# Patient Record
Sex: Female | Born: 1975 | Hispanic: Yes | Smoking: Never smoker
Health system: Southern US, Community
[De-identification: ages and names within clinical notes are randomized; demographics above are authoritative.]

## PROBLEM LIST (undated history)

## (undated) ENCOUNTER — Inpatient Hospital Stay (HOSPITAL_COMMUNITY): Payer: Self-pay

## (undated) DIAGNOSIS — I82409 Acute embolism and thrombosis of unspecified deep veins of unspecified lower extremity: Secondary | ICD-10-CM

## (undated) HISTORY — DX: Acute embolism and thrombosis of unspecified deep veins of unspecified lower extremity: I82.409

## (undated) HISTORY — PX: NO PAST SURGERIES: SHX2092

---

## 1998-11-28 ENCOUNTER — Other Ambulatory Visit: Admission: RE | Admit: 1998-11-28 | Discharge: 1998-11-28 | Payer: Self-pay | Admitting: Obstetrics

## 1999-05-14 ENCOUNTER — Inpatient Hospital Stay (HOSPITAL_COMMUNITY): Admission: AD | Admit: 1999-05-14 | Discharge: 1999-05-15 | Payer: Self-pay | Admitting: *Deleted

## 2002-07-03 ENCOUNTER — Other Ambulatory Visit: Admission: RE | Admit: 2002-07-03 | Discharge: 2002-07-03 | Payer: Self-pay | Admitting: Obstetrics and Gynecology

## 2003-01-29 ENCOUNTER — Inpatient Hospital Stay (HOSPITAL_COMMUNITY): Admission: AD | Admit: 2003-01-29 | Discharge: 2003-01-30 | Payer: Self-pay | Admitting: Obstetrics and Gynecology

## 2006-05-16 ENCOUNTER — Emergency Department (HOSPITAL_COMMUNITY): Admission: EM | Admit: 2006-05-16 | Discharge: 2006-05-16 | Payer: Self-pay | Admitting: *Deleted

## 2006-05-28 ENCOUNTER — Emergency Department (HOSPITAL_COMMUNITY): Admission: EM | Admit: 2006-05-28 | Discharge: 2006-05-28 | Payer: Self-pay | Admitting: *Deleted

## 2006-05-28 ENCOUNTER — Ambulatory Visit (HOSPITAL_COMMUNITY): Admission: RE | Admit: 2006-05-28 | Discharge: 2006-05-28 | Payer: Self-pay | Admitting: Orthopedic Surgery

## 2006-05-28 ENCOUNTER — Encounter (INDEPENDENT_AMBULATORY_CARE_PROVIDER_SITE_OTHER): Payer: Self-pay | Admitting: *Deleted

## 2006-05-31 ENCOUNTER — Encounter: Payer: Self-pay | Admitting: Vascular Surgery

## 2006-05-31 ENCOUNTER — Ambulatory Visit (HOSPITAL_COMMUNITY): Admission: RE | Admit: 2006-05-31 | Discharge: 2006-05-31 | Payer: Self-pay | Admitting: *Deleted

## 2007-10-19 ENCOUNTER — Ambulatory Visit (HOSPITAL_COMMUNITY): Admission: RE | Admit: 2007-10-19 | Discharge: 2007-10-19 | Payer: Self-pay | Admitting: Obstetrics & Gynecology

## 2007-10-19 ENCOUNTER — Encounter: Payer: Self-pay | Admitting: Obstetrics and Gynecology

## 2007-10-19 ENCOUNTER — Ambulatory Visit: Payer: Self-pay | Admitting: Obstetrics & Gynecology

## 2007-10-19 ENCOUNTER — Encounter: Payer: Self-pay | Admitting: Family Medicine

## 2007-10-19 LAB — CONVERTED CEMR LAB: Pap Smear: NORMAL

## 2007-11-02 ENCOUNTER — Ambulatory Visit: Payer: Self-pay | Admitting: Obstetrics & Gynecology

## 2007-11-07 ENCOUNTER — Ambulatory Visit: Payer: Self-pay | Admitting: *Deleted

## 2007-11-14 ENCOUNTER — Ambulatory Visit: Payer: Self-pay | Admitting: Obstetrics & Gynecology

## 2007-11-21 ENCOUNTER — Ambulatory Visit: Payer: Self-pay | Admitting: Obstetrics & Gynecology

## 2007-11-25 ENCOUNTER — Ambulatory Visit: Payer: Self-pay | Admitting: Obstetrics & Gynecology

## 2007-11-25 ENCOUNTER — Inpatient Hospital Stay (HOSPITAL_COMMUNITY): Admission: AD | Admit: 2007-11-25 | Discharge: 2007-11-27 | Payer: Self-pay | Admitting: Obstetrics & Gynecology

## 2007-12-01 ENCOUNTER — Ambulatory Visit: Payer: Self-pay | Admitting: Family Medicine

## 2007-12-01 DIAGNOSIS — Z8672 Personal history of thrombophlebitis: Secondary | ICD-10-CM

## 2007-12-05 ENCOUNTER — Ambulatory Visit: Payer: Self-pay | Admitting: Family Medicine

## 2007-12-05 LAB — CONVERTED CEMR LAB: INR: 2.2

## 2007-12-08 ENCOUNTER — Ambulatory Visit: Payer: Self-pay | Admitting: Family Medicine

## 2007-12-15 ENCOUNTER — Ambulatory Visit: Payer: Self-pay | Admitting: Family Medicine

## 2007-12-15 LAB — CONVERTED CEMR LAB: INR: 3.2

## 2007-12-26 ENCOUNTER — Ambulatory Visit: Payer: Self-pay | Admitting: Family Medicine

## 2008-01-04 ENCOUNTER — Ambulatory Visit: Payer: Self-pay | Admitting: Family Medicine

## 2008-01-19 ENCOUNTER — Ambulatory Visit: Payer: Self-pay | Admitting: Family Medicine

## 2008-01-19 LAB — CONVERTED CEMR LAB: INR: 1.8

## 2008-02-01 ENCOUNTER — Ambulatory Visit: Payer: Self-pay | Admitting: Family Medicine

## 2008-02-08 ENCOUNTER — Ambulatory Visit: Payer: Self-pay | Admitting: Family Medicine

## 2008-02-15 ENCOUNTER — Encounter: Payer: Self-pay | Admitting: Family Medicine

## 2008-02-22 ENCOUNTER — Ambulatory Visit: Payer: Self-pay | Admitting: Family Medicine

## 2008-03-07 ENCOUNTER — Ambulatory Visit: Payer: Self-pay | Admitting: Family Medicine

## 2008-03-07 LAB — CONVERTED CEMR LAB: INR: 1.8

## 2008-03-22 ENCOUNTER — Ambulatory Visit: Payer: Self-pay | Admitting: Family Medicine

## 2008-04-05 ENCOUNTER — Encounter: Payer: Self-pay | Admitting: Family Medicine

## 2008-04-11 ENCOUNTER — Ambulatory Visit: Payer: Self-pay | Admitting: Family Medicine

## 2008-05-02 ENCOUNTER — Encounter: Payer: Self-pay | Admitting: Family Medicine

## 2008-05-22 ENCOUNTER — Ambulatory Visit: Payer: Self-pay | Admitting: Family Medicine

## 2008-05-22 LAB — CONVERTED CEMR LAB: INR: 1.9

## 2008-06-05 ENCOUNTER — Encounter: Payer: Self-pay | Admitting: Family Medicine

## 2010-08-17 ENCOUNTER — Encounter: Payer: Self-pay | Admitting: Family Medicine

## 2010-08-17 ENCOUNTER — Encounter: Payer: Self-pay | Admitting: *Deleted

## 2011-04-20 LAB — POCT URINALYSIS DIP (DEVICE)
Ketones, ur: NEGATIVE
Operator id: 148111
Protein, ur: NEGATIVE
Urobilinogen, UA: 0.2

## 2011-04-21 LAB — POCT URINALYSIS DIP (DEVICE)
Ketones, ur: NEGATIVE
Nitrite: NEGATIVE
Nitrite: NEGATIVE
Operator id: 194561
Operator id: 297281
Operator id: 194561
Operator id: 297281
Protein, ur: NEGATIVE
Protein, ur: NEGATIVE
Protein, ur: NEGATIVE
Protein, ur: NEGATIVE
Urobilinogen, UA: 0.2
Urobilinogen, UA: 0.2
Urobilinogen, UA: 0.2
Urobilinogen, UA: 0.2
pH: 7

## 2013-05-24 LAB — CHG CYTOPATH CERV/VAG THIN LAYER: Pap Smear: NEGATIVE

## 2013-07-27 NOTE — L&D Delivery Note (Addendum)
Patient is 38 y.o. 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  APGAR: 9, 9; weight  pending Placenta status: intact .  Cord: 3 vessel  with the following complications: none  Anesthesia: Epidural  Episiotomy:  N/a Lacerations:  n/a Suture Repair: n/a Est. Blood Loss (mL):  200  Mom to postpartum.  Baby to Couplet care / Skin to Skin.  Melanie Bass 06/08/2014, 1:24 PM    Notified that RN evacuated about 150cc in clots, 1000mcg cytotec given PO.  I removed 350 clots manually, fundus now firm.  Again In and out cath with minimal urine output (pt has already urinated).  Total EBL: 200cc + 150 +350 = 700cc  Stage I PPH, continue with pitocin bolus, hemabate given with lomotil and patient advised of side effects.  STAT CBC.  Melanie Bass,Melanie Chopin ROCIO, MD 3:17 PM

## 2013-12-28 ENCOUNTER — Inpatient Hospital Stay (HOSPITAL_COMMUNITY)
Admission: AD | Admit: 2013-12-28 | Discharge: 2013-12-28 | Disposition: A | Discharge status: Home / Self Care | Payer: Self-pay | Source: Ambulatory Visit | Attending: Obstetrics and Gynecology | Admitting: Obstetrics and Gynecology

## 2013-12-28 ENCOUNTER — Encounter (HOSPITAL_COMMUNITY): Payer: Self-pay

## 2013-12-28 DIAGNOSIS — O368190 Decreased fetal movements, unspecified trimester, not applicable or unspecified: Secondary | ICD-10-CM

## 2013-12-28 DIAGNOSIS — O0930 Supervision of pregnancy with insufficient antenatal care, unspecified trimester: Secondary | ICD-10-CM | POA: Insufficient documentation

## 2013-12-28 MED ORDER — PRENATAL VITAMIN 27-0.8 MG PO TABS: 1.0000 | ORAL_TABLET | Freq: Every day | ORAL | Status: DC

## 2013-12-28 NOTE — MAU Provider Note (Signed)
  History     CSN: [id number]  Arrival date and time: 12/28/13 1236   First Provider Initiated Contact with Patient 12/28/13 1323      No chief complaint on file.  HPI Pt is G5P4004 here for concern decreased fetal movement. She is approx 20 wk by uncertain LMP, no prenatal care. No CTX, no vaginal bleeding/discharge.   She just feels like this baby isn't moving as much. Not doing kick counts. ALso stressed dot to recent death of FOB. In process of setting up prenatal care.    OB History   Grav Para Term Preterm Abortions TAB SAB Ect Mult Living   5 4 4       4       Past Medical History  Diagnosis Date  . Medical history non-contributory     Past Surgical History  Procedure Laterality Date  . No past surgeries      History reviewed. No pertinent family history.  History  Substance Use Topics  . Smoking status: Never Smoker   . Smokeless tobacco: Not on file  . Alcohol Use: No    Allergies: No Known Allergies  Prescriptions prior to admission  Medication Sig Dispense Refill  . Prenatal Vit-Fe Fumarate-FA (PRENATAL MULTIVITAMIN) TABS tablet Take 1 tablet by mouth daily at 12 noon.        Review of Systems  Constitutional: Negative for fever and chills.  Respiratory: Negative for cough and shortness of breath.   Cardiovascular: Negative for chest pain.  Gastrointestinal: Negative for heartburn, nausea, vomiting and abdominal pain.   Physical Exam   Blood pressure 121/63, pulse 75, temperature 99.1 F (37.3 C), temperature source Oral, resp. rate 16, height 5\' 2"  (1.575 m), weight 71.124 kg (156 lb 12.8 oz), last menstrual period 08/05/2013, SpO2 100.00%.  Physical Exam  Constitutional: She is oriented to person, place, and time. She appears well-developed and well-nourished.  Cardiovascular: Normal rate, regular rhythm and normal heart sounds.   No murmur heard. Respiratory: Breath sounds normal. No respiratory distress. She exhibits no tenderness.  GI:  There is no tenderness.  Musculoskeletal: She exhibits no edema.  Neurological: She is alert and oriented to person, place, and time.  Skin: Skin is warm and dry.  FHT performed by nurse: 150  MAU Course  Procedures: none    Assessment and Plan   Perceived decreased fetal movement - reassurance given, FHT present, pt instructed to establish care as outpatient asap. No indication for other labs or [place] today, she will need these with her primary OB. Patient verbalizes understanding. Pt currently coordinating prenatal care.  No prenatal care - see above   Sunnie Nielsen 12/28/2013, 1:34 PM   I spoke with and examined patient and agree with resident's note and plan of care.  Tawana Scale, MD OB Fellow 01/01/2014 5:28 PM

## 2013-12-28 NOTE — MAU Note (Signed)
Patient states she has not had prenatal care and cannot get an appointment until July. Denies pain or bleeding. State the FOB was killed in April and she has been stressed and concerned when she had not felt movement yet.

## 2013-12-28 NOTE — MAU Note (Signed)
Pt denies any vag bleeding, discharge or leaking.  Her only concern is that she perceives the baby is not moving as much as it should.  She has been under a lot of stress and not sure if that is the problem.

## 2013-12-28 NOTE — Discharge Instructions (Signed)
Evaluacin de los movimientos fetales  (Fetal Movement Counts) Nombre del paciente: __________________________________________________ Fecha de parto estimada: ____________________ La evaluacin de los movimientos fetales es muy recomendable en los embarazos de alto riesgo, pero tambin es una buena idea que lo hagan todas las embarazadas. El mdico le indicar que comience a contarlos a las 28 semanas de embarazo. Los movimientos fetales suelen aumentar:   Despus de una comida completa.  Despus de la actividad fsica.  Despus de comer o beber algo dulce o fro.  En reposo. Preste atencin cuando sienta que el beb est ms activo. Esto le ayudar a notar un patrn de ciclos de vigilia y sueo de su beb y cules son los factores que contribuyen a un aumento de los movimientos fetales. Es importante llevar a cabo un recuento de movimientos fetales, al mismo tiempo cada da, cuando el beb normalmente est ms activo.  CMO CONTAR LOS MOVIMIENTOS FETALES 1. Busque un lugar tranquilo y cmodo para sentarse o recostarse sobre el lado izquierdo. Al recostarse sobre su lado izquierdo, le proporciona una mejor circulacin de sangre y oxgeno al beb. 2. Anote el da y la hora en una hoja de papel o en un diario. 3. Comience contando las pataditas, revoloteos, chasquidos, vueltas o pinchazos en un perodo de 2 horas. Debe sentir al menos 10 movimientos en 2 horas. 4. Si no siente 10 movimientos en 2 horas, espere 2  3 horas y cuente de nuevo. Busque cambios en el patrn o si no cuenta lo suficiente en 2 horas. SOLICITE ATENCIN MDICA SI:   Siente menos de 10 pataditas en 2 horas, en dos intentos.  No hay movimientos durante una hora.  El patrn se modifica o le lleva ms tiempo cada da contar las 10 pataditas.  Siente que el beb no se mueve como lo hace habitualmente. Fecha: ____________ Movimientos: ____________ Hora de inicio: ____________ Hora de finalizacin: ____________  Fecha:  ____________ Movimientos: ____________ Hora de inicio: ____________ Hora de finalizacin: ____________  Fecha: ____________ Movimientos: ____________ Hora de inicio: ____________ Hora de finalizacin: ____________  Fecha: ____________ Movimientos: ____________ Hora de inicio: ____________ Hora de finalizacin: ____________  Fecha: ____________ Movimientos: ____________ Hora de inicio: ____________ Hora de finalizacin: ____________  Fecha: ____________ Movimientos: ____________ Hora de inicio: ____________ Hora de finalizacin: ____________  Fecha: ____________ Movimientos: ____________ Hora de inicio: ____________ Hora de finalizacin: ____________  Fecha: ____________ Movimientos: ____________ Hora de inicio: ____________ Hora de finalizacin: ____________  Fecha: ____________ Movimientos: ____________ Hora de inicio: ____________ Hora de finalizacin: ____________  Fecha: ____________ Movimientos: ____________ Hora de inicio: ____________ Hora de finalizacin: ____________  Fecha: ____________ Movimientos: ____________ Hora de inicio: ____________ Hora de finalizacin: ____________  Fecha: ____________ Movimientos: ____________ Hora de inicio: ____________ Hora de finalizacin: ____________  Fecha: ____________ Movimientos: ____________ Hora de inicio: ____________ Hora de finalizacin: ____________  Fecha: ____________ Movimientos: ____________ Hora de inicio: ____________ Hora de finalizacin: ____________  Fecha: ____________ Movimientos: ____________ Hora de inicio: ____________ Hora de finalizacin: ____________  Fecha: ____________ Movimientos: ____________ Hora de inicio: ____________ Hora de finalizacin: ____________  Fecha: ____________ Movimientos: ____________ Hora de inicio: ____________ Hora de finalizacin: ____________  Fecha: ____________ Movimientos: ____________ Hora de inicio: ____________ Hora de finalizacin: ____________  Fecha: ____________ Movimientos: ____________ Hora  de inicio: ____________ Hora de finalizacin: ____________  Fecha: ____________ Movimientos: ____________ Hora de inicio: ____________ Hora de finalizacin: ____________  Fecha: ____________ Movimientos: ____________ Hora de inicio: ____________ Hora de finalizacin: ____________  Fecha: ____________ Movimientos: ____________ Hora de inicio: ____________ Hora de   finalizacin: ____________  Fecha: ____________ Movimientos: ____________ Hora de inicio: ____________ Hora de finalizacin: ____________  Fecha: ____________ Movimientos: ____________ Hora de inicio: ____________ Hora de finalizacin: ____________  Fecha: ____________ Movimientos: ____________ Hora de inicio: ____________ Hora de finalizacin: ____________  Fecha: ____________ Movimientos: ____________ Hora de inicio: ____________ Hora de finalizacin: ____________  Fecha: ____________ Movimientos: ____________ Hora de inicio: ____________ Hora de finalizacin: ____________  Fecha: ____________ Movimientos: ____________ Hora de inicio: ____________ Hora de finalizacin: ____________  Fecha: ____________ Movimientos: ____________ Hora de inicio: ____________ Hora de finalizacin: ____________  Fecha: ____________ Movimientos: ____________ Hora de inicio: ____________ Hora de finalizacin: ____________  Fecha: ____________ Movimientos: ____________ Hora de inicio: ____________ Hora de finalizacin: ____________  Fecha: ____________ Movimientos: ____________ Hora de inicio: ____________ Hora de finalizacin: ____________  Fecha: ____________ Movimientos: ____________ Hora de inicio: ____________ Hora de finalizacin: ____________  Fecha: ____________ Movimientos: ____________ Hora de inicio: ____________ Hora de finalizacin: ____________  Fecha: ____________ Movimientos: ____________ Hora de inicio: ____________ Hora de finalizacin: ____________  Fecha: ____________ Movimientos: ____________ Hora de inicio: ____________ Hora de finalizacin:  ____________  Fecha: ____________ Movimientos: ____________ Hora de inicio: ____________ Hora de finalizacin: ____________  Fecha: ____________ Movimientos: ____________ Hora de inicio: ____________ Hora de finalizacin: ____________  Fecha: ____________ Movimientos: ____________ Hora de inicio: ____________ Hora de finalizacin: ____________  Fecha: ____________ Movimientos: ____________ Hora de inicio: ____________ Hora de finalizacin: ____________  Fecha: ____________ Movimientos: ____________ Hora de inicio: ____________ Hora de finalizacin: ____________  Fecha: ____________ Movimientos: ____________ Hora de inicio: ____________ Hora de finalizacin: ____________  Fecha: ____________ Movimientos: ____________ Hora de inicio: ____________ Hora de finalizacin: ____________  Fecha: ____________ Movimientos: ____________ Hora de inicio: ____________ Hora de finalizacin: ____________  Fecha: ____________ Movimientos: ____________ Hora de inicio: ____________ Hora de finalizacin: ____________  Fecha: ____________ Movimientos: ____________ Hora de inicio: ____________ Hora de finalizacin: ____________  Fecha: ____________ Movimientos: ____________ Hora de inicio: ____________ Hora de finalizacin: ____________  Fecha: ____________ Movimientos: ____________ Hora de inicio: ____________ Hora de finalizacin: ____________  Fecha: ____________ Movimientos: ____________ Hora de inicio: ____________ Hora de finalizacin: ____________  Fecha: ____________ Movimientos: ____________ Hora de inicio: ____________ Hora de finalizacin: ____________  Fecha: ____________ Movimientos: ____________ Hora de inicio: ____________ Hora de finalizacin: ____________  Fecha: ____________ Movimientos: ____________ Hora de inicio: ____________ Hora de finalizacin: ____________  Fecha: ____________ Movimientos: ____________ Hora de inicio: ____________ Hora de finalizacin: ____________  Fecha: ____________  Movimientos: ____________ Hora de inicio: ____________ Hora de finalizacin: ____________  Fecha: ____________ Movimientos: ____________ Hora de inicio: ____________ Hora de finalizacin: ____________  Fecha: ____________ Movimientos: ____________ Hora de inicio: ____________ Hora de finalizacin: ____________  Document Released: 10/20/2007 Document Revised: 06/29/2012 ExitCare Patient Information 2014 [employer].  

## 2014-01-01 NOTE — MAU Provider Note (Signed)
Attestation of Attending Supervision of Advanced Practitioner (CNM/NP): Evaluation and management procedures were performed by the Advanced Practitioner under my supervision and collaboration.  I have reviewed the Advanced Practitioner's note and chart, and I agree with the management and plan.  Claudett Bayly 01/01/2014 6:04 PM

## 2014-01-29 ENCOUNTER — Other Ambulatory Visit (HOSPITAL_COMMUNITY): Payer: Self-pay | Admitting: Nurse Practitioner

## 2014-01-29 DIAGNOSIS — Z3689 Encounter for other specified antenatal screening: Secondary | ICD-10-CM

## 2014-01-29 LAB — OB RESULTS CONSOLE HGB/HCT, BLOOD
HEMATOCRIT: 41 %
HEMOGLOBIN: 14.2 g/dL

## 2014-01-29 LAB — URINE CULTURE: URINE CULTURE, OB: NO GROWTH

## 2014-01-29 LAB — HEMOGLOBINOPATHY EVALUATION: HEMOGLOBINOPATHY PROFILE: NORMAL

## 2014-01-29 LAB — OB RESULTS CONSOLE PLATELET COUNT: PLATELETS: 285 10*3/uL

## 2014-01-29 LAB — OB RESULTS CONSOLE RPR
RPR: NONREACTIVE
RPR: NONREACTIVE

## 2014-01-29 LAB — OB RESULTS CONSOLE HEPATITIS B SURFACE ANTIGEN: HEP B S AG: NEGATIVE

## 2014-01-29 LAB — OB RESULTS CONSOLE GC/CHLAMYDIA
CHLAMYDIA, DNA PROBE: NEGATIVE
GC PROBE AMP, GENITAL: NEGATIVE

## 2014-01-31 ENCOUNTER — Ambulatory Visit (HOSPITAL_COMMUNITY)
Admission: RE | Admit: 2014-01-31 | Discharge: 2014-01-31 | Disposition: A | Discharge status: Home / Self Care | Payer: Self-pay | Source: Ambulatory Visit | Attending: Nurse Practitioner | Admitting: Nurse Practitioner

## 2014-01-31 DIAGNOSIS — Z8672 Personal history of thrombophlebitis: Secondary | ICD-10-CM

## 2014-01-31 DIAGNOSIS — A599 Trichomoniasis, unspecified: Secondary | ICD-10-CM | POA: Insufficient documentation

## 2014-01-31 DIAGNOSIS — F32A Depression, unspecified: Secondary | ICD-10-CM

## 2014-01-31 DIAGNOSIS — Z3689 Encounter for other specified antenatal screening: Secondary | ICD-10-CM

## 2014-01-31 DIAGNOSIS — O0932 Supervision of pregnancy with insufficient antenatal care, second trimester: Secondary | ICD-10-CM

## 2014-01-31 DIAGNOSIS — O0930 Supervision of pregnancy with insufficient antenatal care, unspecified trimester: Secondary | ICD-10-CM | POA: Insufficient documentation

## 2014-01-31 DIAGNOSIS — Z658 Other specified problems related to psychosocial circumstances: Secondary | ICD-10-CM | POA: Insufficient documentation

## 2014-01-31 DIAGNOSIS — IMO0002 Reserved for concepts with insufficient information to code with codable children: Secondary | ICD-10-CM | POA: Insufficient documentation

## 2014-01-31 DIAGNOSIS — F329 Major depressive disorder, single episode, unspecified: Secondary | ICD-10-CM

## 2014-01-31 NOTE — Assessment & Plan Note (Signed)
History of DVT left left 2007, on coumadin x 6 months, during 2009 pregnancy on heparin prophylax.

## 2014-01-31 NOTE — Progress Notes (Signed)
Need EDC clarified - discrepancy between LMP's from Health department  Unsure LMP around 08/16/13, per MAU records LMP 08/05/13 . No ultrasound done yet.

## 2014-02-01 ENCOUNTER — Ambulatory Visit (INDEPENDENT_AMBULATORY_CARE_PROVIDER_SITE_OTHER): Payer: Self-pay | Admitting: Obstetrics & Gynecology

## 2014-02-01 ENCOUNTER — Encounter: Payer: Self-pay | Admitting: Obstetrics & Gynecology

## 2014-02-01 ENCOUNTER — Encounter: Payer: Self-pay | Admitting: *Deleted

## 2014-02-01 VITALS — BP 128/71 | HR 78 | Temp 98.8°F | Wt 160.0 lb

## 2014-02-01 DIAGNOSIS — O09529 Supervision of elderly multigravida, unspecified trimester: Secondary | ICD-10-CM

## 2014-02-01 DIAGNOSIS — O09522 Supervision of elderly multigravida, second trimester: Secondary | ICD-10-CM

## 2014-02-01 LAB — POCT URINALYSIS DIP (DEVICE)
Bilirubin Urine: NEGATIVE
Glucose, UA: NEGATIVE mg/dL
HGB URINE DIPSTICK: NEGATIVE
Ketones, ur: NEGATIVE mg/dL
Leukocytes, UA: NEGATIVE
NITRITE: NEGATIVE
PROTEIN: NEGATIVE mg/dL
SPECIFIC GRAVITY, URINE: 1.02 (ref 1.005–1.030)
UROBILINOGEN UA: 0.2 mg/dL (ref 0.0–1.0)
pH: 7 (ref 5.0–8.0)

## 2014-02-01 MED ORDER — ENOXAPARIN SODIUM 40 MG/0.4ML ~~LOC~~ SOLN: 40.0000 mg | SUBCUTANEOUS | Status: DC

## 2014-02-01 NOTE — Progress Notes (Signed)
[facility] requesting medication assistance for pt that is [redacted] wks gestation w/ a hx of DVT and needs Lovenox.  Pt does not have insurance.  Stressed importance of applying for Medicaid as soon as possible.  MATCH assist was done for pt via [facility].  Instructed RN that this process will take some time and that pt needs to wait several hours before going to [facility].  Nurse voiced understanding.  CM verified w/ [facility] that Rx was received and that [facility].  [facility] has CM's name and number if any questions.  TJohnson, RNBSN   [phone]

## 2014-02-01 NOTE — Progress Notes (Signed)
Edema-feet New ob packet given  FOB passed away in 10/2013

## 2014-02-01 NOTE — Progress Notes (Signed)
Pt had DVT in 2007 after leg trauma.  Needs to be on Lovenox 40mg  daily.  Case Management to help with obtaining medications ([facility]).   Otherwise pt very healthy with no medical history.  NSVD term deliveries x4.  Pt wants [place].  Reviewed complete records from the health dept.   Offered genetic counseling.  Pt wants to think about it.

## 2014-02-01 NOTE — Progress Notes (Signed)
Called case management about patient receiving help with lovenox Rx. Care management [facility] medication. Patient informed and knows to pick up medication there and that it will cost around $3. Patient has filled out medicaid paperwork but hasn't heard anything else. Encouraged patient to talk with reina after leaving today

## 2014-02-07 ENCOUNTER — Encounter: Payer: Self-pay | Admitting: General Practice

## 2014-02-14 ENCOUNTER — Encounter: Payer: Self-pay | Admitting: *Deleted

## 2014-02-19 ENCOUNTER — Encounter: Payer: Self-pay | Admitting: Family Medicine

## 2014-02-23 ENCOUNTER — Encounter: Payer: Self-pay | Admitting: *Deleted

## 2014-03-01 ENCOUNTER — Encounter: Payer: Self-pay | Admitting: Obstetrics and Gynecology

## 2014-03-01 ENCOUNTER — Ambulatory Visit (INDEPENDENT_AMBULATORY_CARE_PROVIDER_SITE_OTHER): Payer: Self-pay | Admitting: Obstetrics and Gynecology

## 2014-03-01 VITALS — BP 126/66 | HR 73 | Temp 97.7°F | Wt 162.8 lb

## 2014-03-01 DIAGNOSIS — O0932 Supervision of pregnancy with insufficient antenatal care, second trimester: Secondary | ICD-10-CM

## 2014-03-01 DIAGNOSIS — O09529 Supervision of elderly multigravida, unspecified trimester: Secondary | ICD-10-CM

## 2014-03-01 DIAGNOSIS — O0930 Supervision of pregnancy with insufficient antenatal care, unspecified trimester: Secondary | ICD-10-CM

## 2014-03-01 DIAGNOSIS — Z658 Other specified problems related to psychosocial circumstances: Secondary | ICD-10-CM

## 2014-03-01 DIAGNOSIS — Z733 Stress, not elsewhere classified: Secondary | ICD-10-CM

## 2014-03-01 DIAGNOSIS — O09522 Supervision of elderly multigravida, second trimester: Secondary | ICD-10-CM

## 2014-03-01 DIAGNOSIS — Z8672 Personal history of thrombophlebitis: Secondary | ICD-10-CM

## 2014-03-01 DIAGNOSIS — IMO0002 Reserved for concepts with insufficient information to code with codable children: Secondary | ICD-10-CM

## 2014-03-01 LAB — POCT URINALYSIS DIP (DEVICE)
Bilirubin Urine: NEGATIVE
Glucose, UA: NEGATIVE mg/dL
Hgb urine dipstick: NEGATIVE
Ketones, ur: NEGATIVE mg/dL
Nitrite: NEGATIVE
PH: 6.5 (ref 5.0–8.0)
PROTEIN: NEGATIVE mg/dL
Specific Gravity, Urine: 1.02 (ref 1.005–1.030)
Urobilinogen, UA: 0.2 mg/dL (ref 0.0–1.0)

## 2014-03-01 NOTE — Progress Notes (Signed)
Reports occasional edema in feet.

## 2014-03-01 NOTE — Progress Notes (Signed)
CM notified by Arlys [facility]Brian at [facility] [phone]) that pt is there to refill her Lovenox and is still showing as a self pay and unsure of how pt will pay for Lovenox.  CM spoke w/ AD and suggested that we speak w/ Nita [phone]) in the pharmacy to discuss pt's options given that she [facility] this year already on 02/01/14. Per Janett BillowNita, she will do a MATCH over ride for the Lovenox once more but that pt will need to speak w/ her Medicaid worker or the financial [facility] to check the status of her Medicaid application. Nita to speak w/ [facility] to notify them of the over ride and to instruct pt on need to have Medicaid application processed as [facility] will not be able to continue to fill the Rx.  CM spoke w/ [facility] and he spoke w/ Janett BillowNita and will fill the Lovenox Rx.  Arlys JohnBrian has tried to explain to the pt that she will need to follow up on the [facility].  CM will follow up with the financial counselor.  CM left message for Burman FosterReyna [phone]) in financial [facility] requesting a call back.  TJohnson, RNBSN  [phone]

## 2014-03-01 NOTE — Progress Notes (Signed)
Patient is doing well without complaints. FM/PTL precautions reviewed. 1hr GCT and third trimester labs next visit

## 2014-03-22 ENCOUNTER — Ambulatory Visit (INDEPENDENT_AMBULATORY_CARE_PROVIDER_SITE_OTHER): Payer: Self-pay | Admitting: Family

## 2014-03-22 VITALS — BP 141/82 | HR 85 | Temp 98.2°F | Wt 164.2 lb

## 2014-03-22 DIAGNOSIS — Z86718 Personal history of other venous thrombosis and embolism: Secondary | ICD-10-CM

## 2014-03-22 DIAGNOSIS — O09529 Supervision of elderly multigravida, unspecified trimester: Secondary | ICD-10-CM

## 2014-03-22 DIAGNOSIS — Z23 Encounter for immunization: Secondary | ICD-10-CM

## 2014-03-22 DIAGNOSIS — O09522 Supervision of elderly multigravida, second trimester: Secondary | ICD-10-CM

## 2014-03-22 LAB — POCT URINALYSIS DIP (DEVICE)
Bilirubin Urine: NEGATIVE
Glucose, UA: NEGATIVE mg/dL
Hgb urine dipstick: NEGATIVE
Ketones, ur: NEGATIVE mg/dL
Nitrite: NEGATIVE
PROTEIN: 30 mg/dL — AB
SPECIFIC GRAVITY, URINE: 1.015 (ref 1.005–1.030)
UROBILINOGEN UA: 0.2 mg/dL (ref 0.0–1.0)
pH: 8.5 — ABNORMAL HIGH (ref 5.0–8.0)

## 2014-03-22 LAB — CBC
HCT: 38.5 % (ref 36.0–46.0)
Hemoglobin: 13.4 g/dL (ref 12.0–15.0)
MCH: 31.6 pg (ref 26.0–34.0)
MCHC: 34.8 g/dL (ref 30.0–36.0)
MCV: 90.8 fL (ref 78.0–100.0)
PLATELETS: 299 10*3/uL (ref 150–400)
RBC: 4.24 MIL/uL (ref 3.87–5.11)
RDW: 12.6 % (ref 11.5–15.5)
WBC: 8.1 10*3/uL (ref 4.0–10.5)

## 2014-03-22 MED ORDER — TETANUS-DIPHTH-ACELL PERTUSSIS 5-2.5-18.5 LF-MCG/0.5 IM SUSP
0.5000 mL | Freq: Once | INTRAMUSCULAR | Status: AC
  Administered 2014-03-22: 0.5 mL via INTRAMUSCULAR

## 2014-03-22 NOTE — Progress Notes (Signed)
Pt concerned regarding may not have Lovenox covered again > will discuss with K. Rassette.  28 wk labs today, add check for hep level.

## 2014-03-22 NOTE — Progress Notes (Signed)
Notified by [facility] AD that pt was in need of Lovenox.  I spoke w/ pt via interpreter regarding her Lovenox.  Pt has used the [facility] program twice for her Lovenox.  CM called Nadara Mustard at the [facility] to discuss this issue.  Nita suggested that CM speak w/ [employer] and [employer].  CM called Baxter Hire in the Pharmacy [phone]) at [facility] and explained the situation and per [facility] they do not have Lovenox.  CM called Nita back in the [facility] w/ this information and per [facility] will have to continue to supply the pt w/ the Lovenox until delivery and then during the bridge to Coumadin - roughly a week or so.  CM called Arlys John at the [employer] and  he is aware that [facility] has approved the continued Lovenox via the [facility] program.  Arlys John suggested that the [facility] effective date be from 03/30/14 until 07/06/14.  Pt is currently [redacted] wks gestation - per pt) and she has 12 days of Lovenox left.  With the time fram listed above from [place], this should cover the pt's delivery and the Coumadin bridge.  Per Arlys John if CM needs to do anything else on the [facility] program they will notify CM.  CM spoke w/ the pt via an interpreter and relayed the above information.  She should go to the [facility] on 03/30/14 to pick up the next 30 day supply of the Lovenox and then every 30 days she should be able to do the same.  Pt's questions answered  TJohnson,  RNBSN  [phone]

## 2014-03-23 LAB — HEPARIN ANTI-XA: Heparin LMW: <0.1 IU/mL

## 2014-03-23 LAB — RPR

## 2014-03-23 LAB — GLUCOSE TOLERANCE, 1 HOUR (50G) W/O FASTING: Glucose, 1 Hour GTT: 188 mg/dL — ABNORMAL HIGH (ref 70–140)

## 2014-03-23 LAB — HIV ANTIBODY (ROUTINE TESTING W REFLEX): HIV 1&2 Ab, 4th Generation: NONREACTIVE

## 2014-03-26 ENCOUNTER — Telehealth: Payer: Self-pay

## 2014-03-26 ENCOUNTER — Encounter: Payer: Self-pay | Admitting: Family

## 2014-03-26 NOTE — Telephone Encounter (Signed)
Message copied by Louanna Raw on Mon Mar 26, 2014  9:49 AM ------      Message from: Marlis Edelson      Created: Mon Mar 26, 2014  9:02 AM      Regarding: 3 hr       Please notify patient to come in for 3 hr GTT.            ----- Message -----         From: Lab in [facility] Interface         Sent: 03/22/2014  10:15 PM           To: ZOXWRUE Kennith Gain, CNM                   ------

## 2014-03-26 NOTE — Telephone Encounter (Signed)
Attempted to call patient with interpreter Maretta Los. No answer. Left message stating we are calling with results, please call clinic.

## 2014-03-27 NOTE — Telephone Encounter (Signed)
Called patient with pacific interpreter [phone], no answer- left message that we are calling with results, please call us back at the clinics

## 2014-03-28 ENCOUNTER — Encounter: Payer: Self-pay | Admitting: General Practice

## 2014-03-28 NOTE — Telephone Encounter (Signed)
Called patient with [facility] for interpreter, no answer- left message that we are trying to reach you with some results, please call us back at the clinics. Will send letter and make note in epic on 9/10 appt.

## 2014-04-05 ENCOUNTER — Ambulatory Visit (INDEPENDENT_AMBULATORY_CARE_PROVIDER_SITE_OTHER): Payer: Self-pay | Admitting: Family

## 2014-04-05 VITALS — BP 142/68 | HR 81 | Wt 165.6 lb

## 2014-04-05 DIAGNOSIS — Z3493 Encounter for supervision of normal pregnancy, unspecified, third trimester: Secondary | ICD-10-CM

## 2014-04-05 DIAGNOSIS — Z3480 Encounter for supervision of other normal pregnancy, unspecified trimester: Secondary | ICD-10-CM

## 2014-04-05 LAB — POCT URINALYSIS DIP (DEVICE)
Bilirubin Urine: NEGATIVE
Glucose, UA: NEGATIVE mg/dL
Ketones, ur: NEGATIVE mg/dL
Leukocytes, UA: NEGATIVE
Nitrite: NEGATIVE
PROTEIN: NEGATIVE mg/dL
SPECIFIC GRAVITY, URINE: 1.02 (ref 1.005–1.030)
UROBILINOGEN UA: 0.2 mg/dL (ref 0.0–1.0)
pH: 7 (ref 5.0–8.0)

## 2014-04-05 NOTE — Progress Notes (Signed)
1hr gtt results abnormal-- needs 3hr gtt. Patient had cookies and milk for breakfast. Explained the need to schedule separate lab appointment for 3hr gtt only. Patient verbalized understanding and will schedule when she leaves appointment today.

## 2014-04-05 NOTE — Progress Notes (Signed)
Will schedule 3 hr for next Tues or Thur.  Due to elevated systolic pt is gestational hypertensive, will begin twice a week testing at 32 wks.

## 2014-04-12 ENCOUNTER — Other Ambulatory Visit: Payer: Self-pay

## 2014-04-12 DIAGNOSIS — R7309 Other abnormal glucose: Secondary | ICD-10-CM

## 2014-04-13 ENCOUNTER — Encounter: Payer: Self-pay | Admitting: Obstetrics and Gynecology

## 2014-04-13 LAB — GLUCOSE TOLERANCE, 3 HOURS
Glucose Tolerance, 1 hour: 128 mg/dL (ref 70–189)
Glucose Tolerance, 2 hour: 141 mg/dL (ref 70–164)
Glucose Tolerance, Fasting: 81 mg/dL (ref 70–104)
Glucose, GTT - 3 Hour: 124 mg/dL (ref 70–144)

## 2014-04-26 ENCOUNTER — Ambulatory Visit (INDEPENDENT_AMBULATORY_CARE_PROVIDER_SITE_OTHER): Payer: Self-pay | Admitting: Family Medicine

## 2014-04-26 DIAGNOSIS — O139 Gestational [pregnancy-induced] hypertension without significant proteinuria, unspecified trimester: Secondary | ICD-10-CM | POA: Insufficient documentation

## 2014-04-26 DIAGNOSIS — O26613 Liver and biliary tract disorders in pregnancy, third trimester: Secondary | ICD-10-CM

## 2014-04-26 DIAGNOSIS — K831 Obstruction of bile duct: Secondary | ICD-10-CM

## 2014-04-26 DIAGNOSIS — O133 Gestational [pregnancy-induced] hypertension without significant proteinuria, third trimester: Secondary | ICD-10-CM

## 2014-04-26 LAB — POCT URINALYSIS DIP (DEVICE)
BILIRUBIN URINE: NEGATIVE
Glucose, UA: NEGATIVE mg/dL
KETONES UR: NEGATIVE mg/dL
Leukocytes, UA: NEGATIVE
Nitrite: NEGATIVE
Protein, ur: NEGATIVE mg/dL
Specific Gravity, Urine: 1.02 (ref 1.005–1.030)
Urobilinogen, UA: 1 mg/dL (ref 0.0–1.0)
pH: 7.5 (ref 5.0–8.0)

## 2014-04-26 LAB — US OB FOLLOW UP

## 2014-04-26 NOTE — Addendum Note (Signed)
Addended by: Jill SideAY, Wyat Infinger L on: 04/26/2014 02:42 PM   Modules accepted: Orders

## 2014-04-26 NOTE — Progress Notes (Signed)
NST reactive.  Baseline 130s.   Tolerating lovenox without problems.  Good fetal activity.  No discharge, leaking fluid. Continue NSTs.

## 2014-04-26 NOTE — Patient Instructions (Signed)
Third Trimester of Pregnancy The third trimester is from week 29 through week 42, months 7 through 9. This trimester is when your unborn baby (fetus) is growing very fast. At the end of the ninth month, the unborn baby is about 20 inches in length. It weighs about 6-10 pounds.  HOME CARE   Avoid all smoking, herbs, and alcohol. Avoid drugs not approved by your doctor.  Only take medicine as told by your doctor. Some medicines are safe and some are not during pregnancy.  Exercise only as told by your doctor. Stop exercising if you start having cramps.  Eat regular, healthy meals.  Wear a good support bra if your breasts are tender.  Do not use hot tubs, steam rooms, or saunas.  Wear your seat belt when driving.  Avoid raw meat, uncooked cheese, and liter boxes and soil used by cats.  Take your prenatal vitamins.  Try taking medicine that helps you poop (stool softener) as needed, and if your doctor approves. Eat more fiber by eating fresh fruit, vegetables, and whole grains. Drink enough fluids to keep your pee (urine) clear or pale yellow.  Take warm water baths (sitz baths) to soothe pain or discomfort caused by hemorrhoids. Use hemorrhoid cream if your doctor approves.  If you have puffy, bulging veins (varicose veins), wear support hose. Raise (elevate) your feet for 15 minutes, 3-4 times a day. Limit salt in your diet.  Avoid heavy lifting, wear low heels, and sit up straight.  Rest with your legs raised if you have leg cramps or low back pain.  Visit your dentist if you have not gone during your pregnancy. Use a soft toothbrush to brush your teeth. Be gentle when you floss.  You can have sex (intercourse) unless your doctor tells you not to.  Do not travel far distances unless you must. Only do so with your doctor's approval.  Take prenatal classes.  Practice driving to the hospital.  Pack your hospital bag.  Prepare the baby's room.  Go to your doctor visits. GET  HELP IF:  You are not sure if you are in labor or if your water has broken.  You are dizzy.  You have mild cramps or pressure in your lower belly (abdominal).  You have a nagging pain in your belly area.  You continue to feel sick to your stomach (nauseous), throw up (vomit), or have watery poop (diarrhea).  You have bad smelling fluid coming from your vagina.  You have pain with peeing (urination). GET HELP RIGHT AWAY IF:   You have a fever.  You are leaking fluid from your vagina.  You are spotting or bleeding from your vagina.  You have severe belly cramping or pain.  You lose or gain weight rapidly.  You have trouble catching your breath and have chest pain.  You notice sudden or extreme puffiness (swelling) of your face, hands, ankles, feet, or legs.  You have not felt the baby move in over an hour.  You have severe headaches that do not go away with medicine.  You have vision changes. Document Released: 10/07/2009 Document Revised: 11/07/2012 Document Reviewed: 09/13/2012 ExitCare Patient Information 2015 [employer]. This information is not intended to replace advice given to you by your health care provider. Make sure you discuss any questions you have with your health care provider.  

## 2014-04-26 NOTE — Progress Notes (Signed)
States abdomen felt different last night-like a contraction  Once- but it woke her up. - will discuss with provider today

## 2014-04-30 ENCOUNTER — Ambulatory Visit (INDEPENDENT_AMBULATORY_CARE_PROVIDER_SITE_OTHER): Payer: Self-pay | Admitting: *Deleted

## 2014-04-30 DIAGNOSIS — O133 Gestational [pregnancy-induced] hypertension without significant proteinuria, third trimester: Secondary | ICD-10-CM

## 2014-04-30 NOTE — Progress Notes (Signed)
US for growth scheduled 10/8

## 2014-05-03 ENCOUNTER — Ambulatory Visit (INDEPENDENT_AMBULATORY_CARE_PROVIDER_SITE_OTHER): Payer: Self-pay | Admitting: Obstetrics & Gynecology

## 2014-05-03 ENCOUNTER — Ambulatory Visit (HOSPITAL_COMMUNITY): Payer: Self-pay

## 2014-05-03 ENCOUNTER — Ambulatory Visit (HOSPITAL_COMMUNITY)
Admission: RE | Admit: 2014-05-03 | Discharge: 2014-05-03 | Disposition: A | Discharge status: Home / Self Care | Payer: Self-pay | Source: Ambulatory Visit | Attending: Family Medicine | Admitting: Family Medicine

## 2014-05-03 VITALS — BP 135/66 | HR 88 | Temp 97.8°F | Wt 171.2 lb

## 2014-05-03 DIAGNOSIS — O24419 Gestational diabetes mellitus in pregnancy, unspecified control: Secondary | ICD-10-CM | POA: Insufficient documentation

## 2014-05-03 DIAGNOSIS — O09523 Supervision of elderly multigravida, third trimester: Secondary | ICD-10-CM

## 2014-05-03 DIAGNOSIS — Z3A00 Weeks of gestation of pregnancy not specified: Secondary | ICD-10-CM | POA: Insufficient documentation

## 2014-05-03 DIAGNOSIS — O133 Gestational [pregnancy-induced] hypertension without significant proteinuria, third trimester: Secondary | ICD-10-CM

## 2014-05-03 LAB — POCT URINALYSIS DIP (DEVICE)
Bilirubin Urine: NEGATIVE
Glucose, UA: NEGATIVE mg/dL
KETONES UR: NEGATIVE mg/dL
LEUKOCYTES UA: NEGATIVE
Nitrite: NEGATIVE
Protein, ur: NEGATIVE mg/dL
SPECIFIC GRAVITY, URINE: 1.015 (ref 1.005–1.030)
Urobilinogen, UA: 0.2 mg/dL (ref 0.0–1.0)
pH: 7 (ref 5.0–8.0)

## 2014-05-03 NOTE — Progress Notes (Signed)
Reactive NST on 04/30/14 

## 2014-05-03 NOTE — Patient Instructions (Signed)
Tercer trimestre de embarazo (Third Trimester of Pregnancy) El tercer trimestre va desde la semana29 hasta la 42, desde el sptimo hasta el noveno mes, y es la poca en la que el feto crece ms rpidamente. Hacia el final del noveno mes, el feto mide alrededor de 20pulgadas (45cm) de largo y pesa entre 6 y 10 libras (2,700 y 4,500kg).  CAMBIOS EN EL ORGANISMO Su organismo atraviesa por muchos cambios durante el embarazo, y estos varan de una mujer a otra.   Seguir aumentando de peso. Es de esperar que aumente entre 25 y 35libras (11 y 16kg) hacia el final del embarazo.  Podrn aparecer las primeras estras en las caderas, el abdomen y las mamas.  Puede tener necesidad de orinar con ms frecuencia porque el feto baja hacia la pelvis y ejerce presin sobre la vejiga.  Debido al embarazo podr sentir acidez estomacal con frecuencia.  Puede estar estreida, ya que ciertas hormonas enlentecen los movimientos de los msculos que empujan los desechos a travs de los intestinos.  Pueden aparecer hemorroides o abultarse e hincharse las venas (venas varicosas).  Puede sentir dolor plvico debido al aumento de peso y a que las hormonas del embarazo relajan las articulaciones entre los huesos de la pelvis. El dolor de espalda puede ser consecuencia de la sobrecarga de los msculos que soportan la postura.  Tal vez haya cambios en el cabello que pueden incluir su engrosamiento, crecimiento rpido y cambios en la textura. Adems, a algunas mujeres se les cae el cabello durante o despus del embarazo, o tienen el cabello seco o fino. Lo ms probable es que el cabello se le normalice despus del nacimiento del beb.  Las mamas seguirn creciendo y le dolern. A veces, puede haber una secrecin amarilla de las mamas llamada calostro.  El ombligo puede salir hacia afuera.  Puede sentir que le falta el aire debido a que se expande el tero.  Puede notar que el feto "baja" o lo siente ms bajo, en el  abdomen.  Puede tener una prdida de secrecin mucosa con sangre. Esto suele ocurrir en el trmino de unos pocos das a una semana antes de que comience el trabajo de parto.  El cuello del tero se vuelve delgado y blando (se borra) cerca de la fecha de parto. QU DEBE ESPERAR EN LOS EXMENES PRENATALES  Le harn exmenes prenatales cada 2semanas hasta la semana36. A partir de ese momento le harn exmenes semanales. Durante una visita prenatal de rutina:  La pesarn para asegurarse de que usted y el feto estn creciendo normalmente.  Le tomarn la presin arterial.  Le medirn el abdomen para controlar el desarrollo del beb.  Se escucharn los latidos cardacos fetales.  Se evaluarn los resultados de los estudios solicitados en visitas anteriores.  Le revisarn el cuello del tero cuando est prxima la fecha de parto para controlar si este se ha borrado. Alrededor de la semana36, el mdico le revisar el cuello del tero. Al mismo tiempo, realizar un anlisis de las secreciones del tejido vaginal. Este examen es para determinar si hay un tipo de bacteria, estreptococo Grupo B. El mdico le explicar esto con ms detalle. El mdico puede preguntarle lo siguiente:  Cmo le gustara que fuera el parto.  Cmo se siente.  Si siente los movimientos del beb.  Si ha tenido sntomas anormales, como prdida de lquido, sangrado, dolores de cabeza intensos o clicos abdominales.  Si tiene alguna pregunta. Otros exmenes o estudios de deteccin que pueden realizarse   durante el tercer trimestre incluyen lo siguiente:  Anlisis de sangre para controlar las concentraciones de hierro (anemia).  Controles fetales para determinar su salud, nivel de actividad y crecimiento. Si tiene alguna enfermedad o hay problemas durante el embarazo, le harn estudios. FALSO TRABAJO DE PARTO Es posible que sienta contracciones leves e irregulares que finalmente desaparecen. Se llaman contracciones de  Braxton Hicks o falso trabajo de parto. Las contracciones pueden durar horas, das o incluso semanas, antes de que el verdadero trabajo de parto se inicie. Si las contracciones ocurren a intervalos regulares, se intensifican o se hacen dolorosas, lo mejor es que la revise el mdico.  SIGNOS DE TRABAJO DE PARTO   Clicos de tipo menstrual.  Contracciones cada 5minutos o menos.  Contracciones que comienzan en la parte superior del tero y se extienden hacia abajo, a la zona inferior del abdomen y la espalda.  Sensacin de mayor presin en la pelvis o dolor de espalda.  Una secrecin de mucosidad acuosa o con sangre que sale de la vagina. Si tiene alguno de estos signos antes de la semana37 del embarazo, llame a su mdico de inmediato. Debe concurrir al hospital para que la controlen inmediatamente. INSTRUCCIONES PARA EL CUIDADO EN EL HOGAR   Evite fumar, consumir hierbas, beber alcohol y tomar frmacos que no le hayan recetado. Estas sustancias qumicas afectan la formacin y el desarrollo del beb.  Siga las indicaciones del mdico en relacin con el uso de medicamentos. Durante el embarazo, hay medicamentos que son seguros de tomar y otros que no.  Haga actividad fsica solo en la forma indicada por el mdico. Sentir clicos uterinos es un buen signo para detener la actividad fsica.  Contine comiendo alimentos que sanos con regularidad.  Use un sostn que le brinde buen soporte si le duelen las mamas.  No se d baos de inmersin en agua caliente, baos turcos ni saunas.  Colquese el cinturn de seguridad cuando conduzca.  No coma carne cruda ni queso sin cocinar; evite el contacto con las bandejas sanitarias de los gatos y la tierra que estos animales usan. Estos elementos contienen grmenes que pueden causar defectos congnitos en el beb.  Tome las vitaminas prenatales.  Si est estreida, pruebe un laxante suave (si el mdico lo autoriza). Consuma ms alimentos ricos en  fibra, como vegetales y frutas frescos y cereales integrales. Beba gran cantidad de lquido para mantener la orina de tono claro o color amarillo plido.  Dese baos de asiento con agua tibia para aliviar el dolor o las molestias causadas por las hemorroides. Use una crema para las hemorroides si el mdico la autoriza.  Si tiene venas varicosas, use medias de descanso. Eleve los pies durante 15minutos, 3 o 4veces por da. Limite la cantidad de sal en su dieta.  Evite levantar objetos pesados, use zapatos de tacones bajos y mantenga una buena postura.  Descanse con las piernas elevadas si tiene calambres o dolor de cintura.  Visite a su dentista si no lo ha hecho durante el embarazo. Use un cepillo de dientes blando para higienizarse los dientes y psese el hilo dental con suavidad.  Puede seguir manteniendo relaciones sexuales, a menos que el mdico le indique lo contrario.  No haga viajes largos excepto que sea absolutamente necesario y solo con la autorizacin del mdico.  Tome clases prenatales para entender, practicar y hacer preguntas sobre el trabajo de parto y el parto.  Haga un ensayo de la partida al hospital.  Prepare el bolso que   llevar al hospital.  Prepare la habitacin del beb.  Concurra a todas las visitas prenatales segn las indicaciones de su mdico. SOLICITE ATENCIN MDICA SI:  No est segura de que est en trabajo de parto o de que ha roto la bolsa de las aguas.  Tiene mareos.  Siente clicos leves, presin en la pelvis o dolor persistente en el abdomen.  Tiene nuseas, vmitos o diarrea persistentes.  Tiene secrecin vaginal con mal olor.  Siente dolor al orinar. SOLICITE ATENCIN MDICA DE INMEDIATO SI:   Tiene fiebre.  Tiene una prdida de lquido por la vagina.  Tiene sangrado o pequeas prdidas vaginales.  Siente dolor intenso o clicos en el abdomen.  Sube o baja de peso rpidamente.  Tiene dificultad para respirar y siente dolor de  pecho.  Sbitamente se le hinchan mucho el rostro, las manos, los tobillos, los pies o las piernas.  No ha sentido los movimientos del beb durante una hora.  Siente un dolor de cabeza intenso que no se alivia con medicamentos.  Hay cambios en la visin. Document Released: 04/22/2005 Document Revised: 07/18/2013 Exit[employer] Patient Information 2015 [employer]. This information is not intended to replace advice given to you by your health care provider. Make sure you discuss any questions you have with your health care provider.  

## 2014-05-03 NOTE — Progress Notes (Signed)
Reports intermittent pelvic pressure.  Reports occasional edema in feet especially at night.

## 2014-05-03 NOTE — Progress Notes (Signed)
NST reactive, BP nl, US scheduled for today, extremities nl

## 2014-05-07 ENCOUNTER — Ambulatory Visit (INDEPENDENT_AMBULATORY_CARE_PROVIDER_SITE_OTHER): Payer: Self-pay | Admitting: *Deleted

## 2014-05-07 VITALS — BP 133/66 | HR 86

## 2014-05-07 DIAGNOSIS — O133 Gestational [pregnancy-induced] hypertension without significant proteinuria, third trimester: Secondary | ICD-10-CM

## 2014-05-07 NOTE — Progress Notes (Signed)
Pt reports having a cough and nasal congestion x 3 days.  OTC meds discussed.

## 2014-05-07 NOTE — Progress Notes (Signed)
NST reviewed and reactive.  

## 2014-05-10 ENCOUNTER — Ambulatory Visit (INDEPENDENT_AMBULATORY_CARE_PROVIDER_SITE_OTHER): Payer: Self-pay | Admitting: Family Medicine

## 2014-05-10 ENCOUNTER — Encounter: Payer: Self-pay | Admitting: Family Medicine

## 2014-05-10 VITALS — BP 134/69 | HR 98 | Temp 98.2°F | Wt 171.7 lb

## 2014-05-10 DIAGNOSIS — O133 Gestational [pregnancy-induced] hypertension without significant proteinuria, third trimester: Secondary | ICD-10-CM

## 2014-05-10 DIAGNOSIS — Z8672 Personal history of thrombophlebitis: Secondary | ICD-10-CM

## 2014-05-10 DIAGNOSIS — O09523 Supervision of elderly multigravida, third trimester: Secondary | ICD-10-CM

## 2014-05-10 LAB — US OB FOLLOW UP

## 2014-05-10 LAB — POCT URINALYSIS DIP (DEVICE)
BILIRUBIN URINE: NEGATIVE
Glucose, UA: NEGATIVE mg/dL
Ketones, ur: NEGATIVE mg/dL
Leukocytes, UA: NEGATIVE
Nitrite: NEGATIVE
Protein, ur: NEGATIVE mg/dL
Specific Gravity, Urine: 1.015 (ref 1.005–1.030)
Urobilinogen, UA: 0.2 mg/dL (ref 0.0–1.0)
pH: 7 (ref 5.0–8.0)

## 2014-05-10 NOTE — Progress Notes (Signed)
Reports occasional edema in feet at night.

## 2014-05-10 NOTE — Patient Instructions (Signed)
Tercer trimestre del embarazo  (Third Trimester of Pregnancy)  El tercer trimestre del embarazo abarca desde la semana 29 hasta la semana 42, desde el 7 mes hasta el 9. En este trimestre el beb (feto) se desarrolla muy rpidamente. Hacia el final del noveno mes, el beb que an no ha nacido mide alrededor de 20 pulgadas (45 cm) de largo. Y pesa entre 6 y 10 libras (2,700 y 4,500 kg).  CUIDADOS EN EL HOGAR   Evite fumar, consumir hierbas y beber alcohol. Evite los frmacos que no apruebe el mdico.  Slo tome los medicamentos que le haya indicado su mdico. Algunos medicamentos son seguros para tomar durante el embarazo y otros no lo son.  Haga ejercicios slo como le indique el mdico. Deje de hacer ejercicios si comienza a tener clicos.  Haga comidas regulares y sanas.  Use un sostn que le brinde buen soporte si sus mamas estn sensibles.  No utilice la baera con agua caliente, baos turcos y saunas.  Colquese el cinturn de seguridad cuando conduzca.  Evite comer carne cruda y el contacto con los utensilios y desperdicios de los gatos.  Tome las vitaminas indicadas para la etapa prenatal.  Trate de tomar medicamentos para mover el intestino (laxantes) segn lo necesario y si su mdico la autoriza. Consuma ms fibra comiendo frutas y vegetales frescos y granos enteros. Beba gran cantidad de lquido para mantener el pis (orina) de tono claro o amarillo plido.  Tome baos de agua tibia (baos de asiento) para calmar el dolor o las molestias causadas por las hemorroides. Use una crema para las hemorroides si el mdico la autoriza.  Si tiene venas hinchadas y abultadas (venas varicosas), use medias de soporte. Eleve (levante) los pies durante 15 minutos, 3 o 4 veces por da. Limite el consumo de sal en su dieta.  Evite levantar objetos pesados, usar tacones altos y sintese derecha.  Descanse con las piernas elevadas si tiene calambres o dolor de cintura.  Visite a su dentista  si no lo ha hecho durante el embarazo. Use un cepillo de dientes blando para higienizarse los dientes. Use suavemente el hilo dental.  Puede tener sexo (relaciones sexuales) siempre que el mdico la autorice.  No viaje por largas distancias si puede evitarlo. Slo hgalo con la aprobacin de su mdico.  Haga el curso pre parto.  Practique conducir hasta el hospital.  Prepare el bolso que llevar.  Prepare la habitacin del beb.  Concurra a los controles mdicos. SOLICITE AYUDA SI:   No est segura si est en trabajo de parto o ha roto la bolsa de aguas.  Tiene mareos.  Siente clicos intensos o presin en la zona baja del vientre (abdomen).  Siente un dolor persistente en la zona del vientre.  Tiene malestar estomacal (nuseas), devuelve (vomita), o tiene deposiciones acuosas (diarrea).  Advierte un olor ftido que proviene de la vagina.  Siente dolor al hacer pis (orinar). SOLICITE AYUDA DE INMEDIATO SI:   Tiene fiebre.  Pierde lquido o sangre por la vagina.  Tiene sangrando o pequeas prdidas vaginales.  Siente dolor intenso o clicos en el abdomen.  Sube o baja de peso rpidamente.  Tiene dificultad para respirar o siente dolor en el pecho.  Sbitamente se le hinchan el rostro, las manos, los tobillos, los pies o las piernas.  No ha sentido los movimientos del beb durante una hora.  Siente un dolor de cabeza intenso que no se alivia con medicamentos.  Su visin se modifica. Document   Released: 03/15/2013 Exit[employer] Patient Information 2015 [employer]. This information is not intended to replace advice given to you by your health care provider. Make sure you discuss any questions you have with your health care provider.  

## 2014-05-10 NOTE — Progress Notes (Signed)
Category 1 tracing with baseline in 140s.  Moderate variability, multiple accelerations, no decelerations. Patient without complaints.  Denies vaginal bleeding, abnormal vaginal discharge, contractions, loss of fluid.  Denies abdominal pain, headache, scotoma.  Reports good fetal activity.  Labor precautions reviewed.  Follow up in 1 weeks.

## 2014-05-14 ENCOUNTER — Ambulatory Visit (INDEPENDENT_AMBULATORY_CARE_PROVIDER_SITE_OTHER): Payer: Self-pay | Admitting: *Deleted

## 2014-05-14 VITALS — BP 133/67 | HR 84

## 2014-05-14 DIAGNOSIS — O133 Gestational [pregnancy-induced] hypertension without significant proteinuria, third trimester: Secondary | ICD-10-CM

## 2014-05-14 NOTE — Progress Notes (Signed)
Category 1 tracing with baseline in 130s.  Moderate variability, multiple accelerations, no decelerations.  

## 2014-05-17 ENCOUNTER — Other Ambulatory Visit: Payer: Self-pay | Admitting: Family Medicine

## 2014-05-17 ENCOUNTER — Ambulatory Visit (INDEPENDENT_AMBULATORY_CARE_PROVIDER_SITE_OTHER): Payer: Self-pay | Admitting: Family Medicine

## 2014-05-17 VITALS — BP 128/67 | HR 99 | Temp 98.6°F | Wt 171.0 lb

## 2014-05-17 DIAGNOSIS — O09523 Supervision of elderly multigravida, third trimester: Secondary | ICD-10-CM

## 2014-05-17 DIAGNOSIS — Z3493 Encounter for supervision of normal pregnancy, unspecified, third trimester: Secondary | ICD-10-CM

## 2014-05-17 DIAGNOSIS — Z23 Encounter for immunization: Secondary | ICD-10-CM

## 2014-05-17 DIAGNOSIS — O133 Gestational [pregnancy-induced] hypertension without significant proteinuria, third trimester: Secondary | ICD-10-CM

## 2014-05-17 DIAGNOSIS — Z8672 Personal history of thrombophlebitis: Secondary | ICD-10-CM

## 2014-05-17 LAB — POCT URINALYSIS DIP (DEVICE)
BILIRUBIN URINE: NEGATIVE
GLUCOSE, UA: NEGATIVE mg/dL
Hgb urine dipstick: NEGATIVE
KETONES UR: NEGATIVE mg/dL
LEUKOCYTES UA: NEGATIVE
NITRITE: NEGATIVE
Protein, ur: NEGATIVE mg/dL
Specific Gravity, Urine: 1.02 (ref 1.005–1.030)
Urobilinogen, UA: 1 mg/dL (ref 0.0–1.0)
pH: 7 (ref 5.0–8.0)

## 2014-05-17 LAB — US OB FOLLOW UP

## 2014-05-17 LAB — OB RESULTS CONSOLE GBS: STREP GROUP B AG: NEGATIVE

## 2014-05-17 LAB — OB RESULTS CONSOLE GC/CHLAMYDIA
CHLAMYDIA, DNA PROBE: NEGATIVE
Gonorrhea: NEGATIVE

## 2014-05-17 NOTE — Progress Notes (Signed)
Cultures today Flu vaccine NST/AFI

## 2014-05-17 NOTE — Patient Instructions (Signed)
Tercer trimestre del embarazo  (Third Trimester of Pregnancy)  El tercer trimestre del embarazo abarca desde la semana 29 hasta la semana 42, desde el 7 mes hasta el 9. En este trimestre el beb (feto) se desarrolla muy rpidamente. Hacia el final del noveno mes, el beb que an no ha nacido mide alrededor de 20 pulgadas (45 cm) de largo. Y pesa entre 6 y 10 libras (2,700 y 4,500 kg).  CUIDADOS EN EL HOGAR   Evite fumar, consumir hierbas y beber alcohol. Evite los frmacos que no apruebe el mdico.  Slo tome los medicamentos que le haya indicado su mdico. Algunos medicamentos son seguros para tomar durante el embarazo y otros no lo son.  Haga ejercicios slo como le indique el mdico. Deje de hacer ejercicios si comienza a tener clicos.  Haga comidas regulares y sanas.  Use un sostn que le brinde buen soporte si sus mamas estn sensibles.  No utilice la baera con agua caliente, baos turcos y saunas.  Colquese el cinturn de seguridad cuando conduzca.  Evite comer carne cruda y el contacto con los utensilios y desperdicios de los gatos.  Tome las vitaminas indicadas para la etapa prenatal.  Trate de tomar medicamentos para mover el intestino (laxantes) segn lo necesario y si su mdico la autoriza. Consuma ms fibra comiendo frutas y vegetales frescos y granos enteros. Beba gran cantidad de lquido para mantener el pis (orina) de tono claro o amarillo plido.  Tome baos de agua tibia (baos de asiento) para calmar el dolor o las molestias causadas por las hemorroides. Use una crema para las hemorroides si el mdico la autoriza.  Si tiene venas hinchadas y abultadas (venas varicosas), use medias de soporte. Eleve (levante) los pies durante 15 minutos, 3 o 4 veces por da. Limite el consumo de sal en su dieta.  Evite levantar objetos pesados, usar tacones altos y sintese derecha.  Descanse con las piernas elevadas si tiene calambres o dolor de cintura.  Visite a su dentista  si no lo ha hecho durante el embarazo. Use un cepillo de dientes blando para higienizarse los dientes. Use suavemente el hilo dental.  Puede tener sexo (relaciones sexuales) siempre que el mdico la autorice.  No viaje por largas distancias si puede evitarlo. Slo hgalo con la aprobacin de su mdico.  Haga el curso pre parto.  Practique conducir hasta el hospital.  Prepare el bolso que llevar.  Prepare la habitacin del beb.  Concurra a los controles mdicos. SOLICITE AYUDA SI:   No est segura si est en trabajo de parto o ha roto la bolsa de aguas.  Tiene mareos.  Siente clicos intensos o presin en la zona baja del vientre (abdomen).  Siente un dolor persistente en la zona del vientre.  Tiene malestar estomacal (nuseas), devuelve (vomita), o tiene deposiciones acuosas (diarrea).  Advierte un olor ftido que proviene de la vagina.  Siente dolor al hacer pis (orinar). SOLICITE AYUDA DE INMEDIATO SI:   Tiene fiebre.  Pierde lquido o sangre por la vagina.  Tiene sangrando o pequeas prdidas vaginales.  Siente dolor intenso o clicos en el abdomen.  Sube o baja de peso rpidamente.  Tiene dificultad para respirar o siente dolor en el pecho.  Sbitamente se le hinchan el rostro, las manos, los tobillos, los pies o las piernas.  No ha sentido los movimientos del beb durante una hora.  Siente un dolor de cabeza intenso que no se alivia con medicamentos.  Su visin se modifica. Document   Released: 03/15/2013 Exit[employer] Patient Information 2015 [employer]. This information is not intended to replace advice given to you by your health care provider. Make sure you discuss any questions you have with your health care provider.  

## 2014-05-17 NOTE — Progress Notes (Signed)
Category 1 tracing with baseline in 130s.  Moderate variability, multiple accelerations, no decelerations. Patient without complaints.  Denies vaginal bleeding, abnormal vaginal discharge, contractions, loss of fluid.  Denies abdominal pain, headache, scotoma.  Reports good fetal activity.  Labor precautions reviewed.

## 2014-05-18 LAB — GC/CHLAMYDIA PROBE AMP
CT PROBE, AMP APTIMA: NEGATIVE
GC Probe RNA: NEGATIVE

## 2014-05-19 LAB — CULTURE, BETA STREP (GROUP B ONLY)

## 2014-05-21 ENCOUNTER — Encounter: Payer: Self-pay | Admitting: Family Medicine

## 2014-05-21 ENCOUNTER — Ambulatory Visit (INDEPENDENT_AMBULATORY_CARE_PROVIDER_SITE_OTHER): Payer: Self-pay | Admitting: *Deleted

## 2014-05-21 VITALS — BP 125/66 | HR 94

## 2014-05-21 DIAGNOSIS — O133 Gestational [pregnancy-induced] hypertension without significant proteinuria, third trimester: Secondary | ICD-10-CM

## 2014-05-21 NOTE — Progress Notes (Signed)
10/26 NST reviewed and reactive 

## 2014-05-24 ENCOUNTER — Ambulatory Visit (INDEPENDENT_AMBULATORY_CARE_PROVIDER_SITE_OTHER): Payer: Self-pay | Admitting: Family Medicine

## 2014-05-24 VITALS — BP 117/64 | HR 85 | Temp 98.0°F | Wt 175.0 lb

## 2014-05-24 DIAGNOSIS — O09523 Supervision of elderly multigravida, third trimester: Secondary | ICD-10-CM

## 2014-05-24 DIAGNOSIS — O133 Gestational [pregnancy-induced] hypertension without significant proteinuria, third trimester: Secondary | ICD-10-CM

## 2014-05-24 LAB — POCT URINALYSIS DIP (DEVICE)
Bilirubin Urine: NEGATIVE
GLUCOSE, UA: NEGATIVE mg/dL
Ketones, ur: NEGATIVE mg/dL
Nitrite: NEGATIVE
Protein, ur: NEGATIVE mg/dL
Specific Gravity, Urine: 1.02 (ref 1.005–1.030)
Urobilinogen, UA: 0.2 mg/dL (ref 0.0–1.0)
pH: 7.5 (ref 5.0–8.0)

## 2014-05-24 LAB — US OB FOLLOW UP

## 2014-05-24 NOTE — Progress Notes (Signed)
Category 1 tracing with baseline in 145.  Moderate variability, multiple accelerations, no decelerations. Patient carries diagnosis of GHTN, based on elevated BP at the end of Aug and middle of Sept.  BPs since that time have been normal range and 117/64 today.  I discussed the patient with Dr Sherrie Georgeecker of [facility]FM - who agrees that we can wait until 39 weeks to induce labor, but keep the patient in twice weekly testing. No contractions, leaking fluid, decreased movement.

## 2014-05-24 NOTE — Patient Instructions (Signed)
Tercer trimestre del embarazo  (Third Trimester of Pregnancy)  El tercer trimestre del embarazo abarca desde la semana 29 hasta la semana 42, desde el 7 mes hasta el 9. En este trimestre el beb (feto) se desarrolla muy rpidamente. Hacia el final del noveno mes, el beb que an no ha nacido mide alrededor de 20 pulgadas (45 cm) de largo. Y pesa entre 6 y 10 libras (2,700 y 4,500 kg).  CUIDADOS EN EL HOGAR   Evite fumar, consumir hierbas y beber alcohol. Evite los frmacos que no apruebe el mdico.  Slo tome los medicamentos que le haya indicado su mdico. Algunos medicamentos son seguros para tomar durante el embarazo y otros no lo son.  Haga ejercicios slo como le indique el mdico. Deje de hacer ejercicios si comienza a tener clicos.  Haga comidas regulares y sanas.  Use un sostn que le brinde buen soporte si sus mamas estn sensibles.  No utilice la baera con agua caliente, baos turcos y saunas.  Colquese el cinturn de seguridad cuando conduzca.  Evite comer carne cruda y el contacto con los utensilios y desperdicios de los gatos.  Tome las vitaminas indicadas para la etapa prenatal.  Trate de tomar medicamentos para mover el intestino (laxantes) segn lo necesario y si su mdico la autoriza. Consuma ms fibra comiendo frutas y vegetales frescos y granos enteros. Beba gran cantidad de lquido para mantener el pis (orina) de tono claro o amarillo plido.  Tome baos de agua tibia (baos de asiento) para calmar el dolor o las molestias causadas por las hemorroides. Use una crema para las hemorroides si el mdico la autoriza.  Si tiene venas hinchadas y abultadas (venas varicosas), use medias de soporte. Eleve (levante) los pies durante 15 minutos, 3 o 4 veces por da. Limite el consumo de sal en su dieta.  Evite levantar objetos pesados, usar tacones altos y sintese derecha.  Descanse con las piernas elevadas si tiene calambres o dolor de cintura.  Visite a su dentista  si no lo ha hecho durante el embarazo. Use un cepillo de dientes blando para higienizarse los dientes. Use suavemente el hilo dental.  Puede tener sexo (relaciones sexuales) siempre que el mdico la autorice.  No viaje por largas distancias si puede evitarlo. Slo hgalo con la aprobacin de su mdico.  Haga el curso pre parto.  Practique conducir hasta el hospital.  Prepare el bolso que llevar.  Prepare la habitacin del beb.  Concurra a los controles mdicos. SOLICITE AYUDA SI:   No est segura si est en trabajo de parto o ha roto la bolsa de aguas.  Tiene mareos.  Siente clicos intensos o presin en la zona baja del vientre (abdomen).  Siente un dolor persistente en la zona del vientre.  Tiene malestar estomacal (nuseas), devuelve (vomita), o tiene deposiciones acuosas (diarrea).  Advierte un olor ftido que proviene de la vagina.  Siente dolor al hacer pis (orinar). SOLICITE AYUDA DE INMEDIATO SI:   Tiene fiebre.  Pierde lquido o sangre por la vagina.  Tiene sangrando o pequeas prdidas vaginales.  Siente dolor intenso o clicos en el abdomen.  Sube o baja de peso rpidamente.  Tiene dificultad para respirar o siente dolor en el pecho.  Sbitamente se le hinchan el rostro, las manos, los tobillos, los pies o las piernas.  No ha sentido los movimientos del beb durante una hora.  Siente un dolor de cabeza intenso que no se alivia con medicamentos.  Su visin se modifica. Document   Released: 03/15/2013 Exit[employer] Patient Information 2015 [employer]. This information is not intended to replace advice given to you by your health care provider. Make sure you discuss any questions you have with your health care provider.  

## 2014-05-24 NOTE — Progress Notes (Signed)
OBF/NST/AFI  Interpreter Lorinda Creedaquel Mora present for encounter

## 2014-05-28 ENCOUNTER — Ambulatory Visit (INDEPENDENT_AMBULATORY_CARE_PROVIDER_SITE_OTHER): Payer: Self-pay | Admitting: General Practice

## 2014-05-28 ENCOUNTER — Telehealth: Payer: Self-pay | Admitting: Obstetrics & Gynecology

## 2014-05-28 VITALS — BP 128/75 | HR 82 | Wt 175.4 lb

## 2014-05-28 DIAGNOSIS — O133 Gestational [pregnancy-induced] hypertension without significant proteinuria, third trimester: Secondary | ICD-10-CM

## 2014-05-28 NOTE — Progress Notes (Signed)
Patient here only for NST, sylvia present for interpreter

## 2014-05-31 ENCOUNTER — Telehealth (HOSPITAL_COMMUNITY): Payer: Self-pay | Admitting: *Deleted

## 2014-05-31 ENCOUNTER — Ambulatory Visit (INDEPENDENT_AMBULATORY_CARE_PROVIDER_SITE_OTHER): Payer: Self-pay | Admitting: Family

## 2014-05-31 VITALS — BP 133/65 | HR 76 | Temp 97.9°F | Wt 174.8 lb

## 2014-05-31 DIAGNOSIS — O133 Gestational [pregnancy-induced] hypertension without significant proteinuria, third trimester: Secondary | ICD-10-CM

## 2014-05-31 LAB — POCT URINALYSIS DIP (DEVICE)
Glucose, UA: NEGATIVE mg/dL
Ketones, ur: NEGATIVE mg/dL
LEUKOCYTES UA: NEGATIVE
Nitrite: NEGATIVE
PROTEIN: 30 mg/dL — AB
Specific Gravity, Urine: 1.02 (ref 1.005–1.030)
Urobilinogen, UA: 1 mg/dL (ref 0.0–1.0)
pH: 7 (ref 5.0–8.0)

## 2014-05-31 LAB — US OB FOLLOW UP

## 2014-05-31 NOTE — Progress Notes (Signed)
Reports intermittent pelvic pressure, no pain.  Denies vaginal bleeding or LOF.  IOL scheduled for 06/07/14.  Pt only has two more days of Lovenox left, questions regarding needing to continue.  Explained to patient to pick up Lovenox and continue to take 24 hours prior to IOL.

## 2014-05-31 NOTE — Addendum Note (Signed)
Addended by: Jill SideAY, DIANE L on: 05/31/2014 11:58 AM   Modules accepted: Orders

## 2014-05-31 NOTE — Telephone Encounter (Signed)
Preadmission screen  

## 2014-05-31 NOTE — Progress Notes (Signed)
Reports intermittent pelvic pressure but no pain.  Edema in hands and feet, especially at night.  Melanie SenegalMarly Bass used as interpreter for this encounter.

## 2014-06-01 NOTE — Telephone Encounter (Signed)
Interpreter number [phone]

## 2014-06-04 ENCOUNTER — Ambulatory Visit (INDEPENDENT_AMBULATORY_CARE_PROVIDER_SITE_OTHER): Payer: Self-pay | Admitting: *Deleted

## 2014-06-04 VITALS — BP 129/66 | HR 79

## 2014-06-04 DIAGNOSIS — O133 Gestational [pregnancy-induced] hypertension without significant proteinuria, third trimester: Secondary | ICD-10-CM

## 2014-06-04 NOTE — Progress Notes (Signed)
Category 1 tracing with baseline in 130s.  Moderate variability, multiple accelerations, no decelerations.  

## 2014-06-04 NOTE — Progress Notes (Signed)
IOL scheduled 11/12.

## 2014-06-07 ENCOUNTER — Encounter (HOSPITAL_COMMUNITY): Payer: Self-pay

## 2014-06-07 ENCOUNTER — Inpatient Hospital Stay (HOSPITAL_COMMUNITY): Payer: Medicaid Other | Admitting: Anesthesiology

## 2014-06-07 ENCOUNTER — Inpatient Hospital Stay (HOSPITAL_COMMUNITY)
Admission: RE | Admit: 2014-06-07 | Discharge: 2014-06-10 | DRG: 774 | Disposition: A | Discharge status: Home / Self Care | Payer: Medicaid Other | Source: Ambulatory Visit | Attending: Obstetrics & Gynecology | Admitting: Obstetrics & Gynecology

## 2014-06-07 VITALS — BP 120/48 | HR 74 | Temp 98.7°F | Resp 18 | Ht 62.0 in | Wt 175.0 lb

## 2014-06-07 DIAGNOSIS — F32A Depression, unspecified: Secondary | ICD-10-CM

## 2014-06-07 DIAGNOSIS — O0932 Supervision of pregnancy with insufficient antenatal care, second trimester: Secondary | ICD-10-CM

## 2014-06-07 DIAGNOSIS — F329 Major depressive disorder, single episode, unspecified: Secondary | ICD-10-CM

## 2014-06-07 DIAGNOSIS — Z8672 Personal history of thrombophlebitis: Secondary | ICD-10-CM

## 2014-06-07 DIAGNOSIS — O09523 Supervision of elderly multigravida, third trimester: Secondary | ICD-10-CM | POA: Diagnosis not present

## 2014-06-07 DIAGNOSIS — Z86718 Personal history of other venous thrombosis and embolism: Secondary | ICD-10-CM

## 2014-06-07 DIAGNOSIS — O139 Gestational [pregnancy-induced] hypertension without significant proteinuria, unspecified trimester: Secondary | ICD-10-CM | POA: Diagnosis present

## 2014-06-07 DIAGNOSIS — Z658 Other specified problems related to psychosocial circumstances: Secondary | ICD-10-CM

## 2014-06-07 DIAGNOSIS — O133 Gestational [pregnancy-induced] hypertension without significant proteinuria, third trimester: Secondary | ICD-10-CM | POA: Diagnosis present

## 2014-06-07 DIAGNOSIS — Z833 Family history of diabetes mellitus: Secondary | ICD-10-CM | POA: Diagnosis not present

## 2014-06-07 DIAGNOSIS — Z3A39 39 weeks gestation of pregnancy: Secondary | ICD-10-CM | POA: Diagnosis present

## 2014-06-07 LAB — COMPREHENSIVE METABOLIC PANEL
ALT: 17 U/L (ref 0–35)
AST: 20 U/L (ref 0–37)
Albumin: 2.6 g/dL — ABNORMAL LOW (ref 3.5–5.2)
Alkaline Phosphatase: 162 U/L — ABNORMAL HIGH (ref 39–117)
Anion gap: 19 — ABNORMAL HIGH (ref 5–15)
BILIRUBIN TOTAL: 0.5 mg/dL (ref 0.3–1.2)
BUN: 7 mg/dL (ref 6–23)
CHLORIDE: 101 meq/L (ref 96–112)
CO2: 18 meq/L — AB (ref 19–32)
Calcium: 9.1 mg/dL (ref 8.4–10.5)
Creatinine, Ser: 0.66 mg/dL (ref 0.50–1.10)
GFR calc Af Amer: >90 mL/min (ref >90)
Glucose, Bld: 125 mg/dL — ABNORMAL HIGH (ref 70–99)
Potassium: 3.6 mEq/L — ABNORMAL LOW (ref 3.7–5.3)
Sodium: 138 mEq/L (ref 137–147)
Total Protein: 6.1 g/dL (ref 6.0–8.3)

## 2014-06-07 LAB — CBC
HCT: 39.9 % (ref 36.0–46.0)
HCT: 38.7 % (ref 36.0–46.0)
HEMOGLOBIN: 13.6 g/dL (ref 12.0–15.0)
Hemoglobin: 13.3 g/dL (ref 12.0–15.0)
MCH: 31.4 pg (ref 26.0–34.0)
MCH: 32 pg (ref 26.0–34.0)
MCHC: 34.1 g/dL (ref 30.0–36.0)
MCHC: 34.4 g/dL (ref 30.0–36.0)
MCV: 92.1 fL (ref 78.0–100.0)
MCV: 93 fL (ref 78.0–100.0)
Platelets: 258 10*3/uL (ref 150–400)
Platelets: 229 10*3/uL (ref 150–400)
RBC: 4.33 MIL/uL (ref 3.87–5.11)
RBC: 4.16 MIL/uL (ref 3.87–5.11)
RDW: 12.9 % (ref 11.5–15.5)
RDW: 13.2 % (ref 11.5–15.5)
WBC: 7.2 10*3/uL (ref 4.0–10.5)
WBC: 10.5 10*3/uL (ref 4.0–10.5)

## 2014-06-07 LAB — URINALYSIS, ROUTINE W REFLEX MICROSCOPIC
Bilirubin Urine: NEGATIVE
GLUCOSE, UA: NEGATIVE mg/dL
Ketones, ur: NEGATIVE mg/dL
Leukocytes, UA: NEGATIVE
Nitrite: NEGATIVE
Protein, ur: NEGATIVE mg/dL
Specific Gravity, Urine: 1.02 (ref 1.005–1.030)
Urobilinogen, UA: 1 mg/dL (ref 0.0–1.0)
pH: 7.5 (ref 5.0–8.0)

## 2014-06-07 LAB — TYPE AND SCREEN
ABO/RH(D): O POS
Antibody Screen: NEGATIVE

## 2014-06-07 LAB — URINE MICROSCOPIC-ADD ON

## 2014-06-07 LAB — PROTEIN / CREATININE RATIO, URINE
Creatinine, Urine: 96.74 mg/dL
Protein Creatinine Ratio: 0.1 (ref 0.00–0.15)
Total Protein, Urine: 9.9 mg/dL

## 2014-06-07 LAB — HIV ANTIBODY (ROUTINE TESTING W REFLEX): HIV 1&2 Ab, 4th Generation: NONREACTIVE

## 2014-06-07 LAB — RPR

## 2014-06-07 LAB — ABO/RH: ABO/RH(D): O POS

## 2014-06-07 MED ORDER — ACETAMINOPHEN 325 MG PO TABS: 650.0000 mg | ORAL_TABLET | ORAL | Status: DC | PRN

## 2014-06-07 MED ORDER — PHENYLEPHRINE 40 MCG/ML (10ML) SYRINGE FOR IV PUSH (FOR BLOOD PRESSURE SUPPORT)
80.0000 ug | PREFILLED_SYRINGE | INTRAVENOUS | Status: DC | PRN
  Filled 2014-06-07: qty 2

## 2014-06-07 MED ORDER — LACTATED RINGERS IV SOLN: 500.0000 mL | Freq: Once | INTRAVENOUS | Status: DC

## 2014-06-07 MED ORDER — LACTATED RINGERS IV SOLN: 500.0000 mL | INTRAVENOUS | Status: DC | PRN

## 2014-06-07 MED ORDER — PHENYLEPHRINE 40 MCG/ML (10ML) SYRINGE FOR IV PUSH (FOR BLOOD PRESSURE SUPPORT)
80.0000 ug | PREFILLED_SYRINGE | INTRAVENOUS | Status: DC | PRN
  Filled 2014-06-07: qty 2
  Filled 2014-06-07: qty 10

## 2014-06-07 MED ORDER — OXYTOCIN 40 UNITS IN LACTATED RINGERS INFUSION - SIMPLE MED
62.5000 mL/h | INTRAVENOUS | Status: DC
  Administered 2014-06-08: 62.5 mL/h via INTRAVENOUS
  Filled 2014-06-07: qty 1000

## 2014-06-07 MED ORDER — CITRIC ACID-SODIUM CITRATE 334-500 MG/5ML PO SOLN: 30.0000 mL | ORAL | Status: DC | PRN

## 2014-06-07 MED ORDER — LACTATED RINGERS IV SOLN
INTRAVENOUS | Status: DC
Start: 2014-06-07 — End: 2014-06-08
  Administered 2014-06-07 – 2014-06-08 (×2): via INTRAVENOUS

## 2014-06-07 MED ORDER — OXYTOCIN 40 UNITS IN LACTATED RINGERS INFUSION - SIMPLE MED
1.0000 m[IU]/min | INTRAVENOUS | Status: DC
  Administered 2014-06-07: 1 m[IU]/min via INTRAVENOUS
  Filled 2014-06-07: qty 1000

## 2014-06-07 MED ORDER — MISOPROSTOL 25 MCG QUARTER TABLET
25.0000 ug | ORAL_TABLET | ORAL | Status: DC | PRN
  Administered 2014-06-07: 25 ug via VAGINAL
  Filled 2014-06-07: qty 0.25
  Filled 2014-06-07: qty 1
  Filled 2014-06-07: qty 0.25

## 2014-06-07 MED ORDER — OXYCODONE-ACETAMINOPHEN 5-325 MG PO TABS: 1.0000 | ORAL_TABLET | ORAL | Status: DC | PRN

## 2014-06-07 MED ORDER — FENTANYL CITRATE 0.05 MG/ML IJ SOLN
100.0000 ug | INTRAMUSCULAR | Status: DC | PRN
  Administered 2014-06-07 – 2014-06-08 (×4): 100 ug via INTRAVENOUS
  Filled 2014-06-07 (×4): qty 2

## 2014-06-07 MED ORDER — EPHEDRINE 5 MG/ML INJ
10.0000 mg | INTRAVENOUS | Status: DC | PRN
  Filled 2014-06-07: qty 2

## 2014-06-07 MED ORDER — FENTANYL 2.5 MCG/ML BUPIVACAINE 1/10 % EPIDURAL INFUSION (WH - ANES)
14.0000 mL/h | INTRAMUSCULAR | Status: DC | PRN
  Administered 2014-06-07 – 2014-06-08 (×2): 14 mL/h via EPIDURAL
  Filled 2014-06-07 (×2): qty 125

## 2014-06-07 MED ORDER — OXYTOCIN BOLUS FROM INFUSION
500.0000 mL | INTRAVENOUS | Status: DC
  Administered 2014-06-08: 500 mL via INTRAVENOUS

## 2014-06-07 MED ORDER — OXYCODONE-ACETAMINOPHEN 5-325 MG PO TABS: 2.0000 | ORAL_TABLET | ORAL | Status: DC | PRN

## 2014-06-07 MED ORDER — DIPHENHYDRAMINE HCL 50 MG/ML IJ SOLN: 12.5000 mg | INTRAMUSCULAR | Status: DC | PRN

## 2014-06-07 MED ORDER — ZOLPIDEM TARTRATE 5 MG PO TABS: 5.0000 mg | ORAL_TABLET | Freq: Every evening | ORAL | Status: DC | PRN

## 2014-06-07 MED ORDER — TERBUTALINE SULFATE 1 MG/ML IJ SOLN: 0.2500 mg | Freq: Once | INTRAMUSCULAR | Status: AC | PRN

## 2014-06-07 MED ORDER — LIDOCAINE HCL (PF) 1 % IJ SOLN
30.0000 mL | INTRAMUSCULAR | Status: DC | PRN
  Filled 2014-06-07: qty 30

## 2014-06-07 MED ORDER — FENTANYL 2.5 MCG/ML BUPIVACAINE 1/10 % EPIDURAL INFUSION (WH - ANES)
INTRAMUSCULAR | Status: DC | PRN
  Administered 2014-06-07: 13 mL/h via EPIDURAL

## 2014-06-07 MED ORDER — LIDOCAINE HCL (PF) 1 % IJ SOLN
INTRAMUSCULAR | Status: DC | PRN
  Administered 2014-06-07 (×2): 4 mL

## 2014-06-07 MED ORDER — ONDANSETRON HCL 4 MG/2ML IJ SOLN
4.0000 mg | Freq: Four times a day (QID) | INTRAMUSCULAR | Status: DC | PRN
  Administered 2014-06-07: 4 mg via INTRAVENOUS
  Filled 2014-06-07: qty 2

## 2014-06-07 NOTE — Plan of Care (Signed)
Problem: Consults Goal: Automotive engineer Patient Education (See Patient Education module for education specifics.)  Outcome: Completed/Met Date Met:  06/07/14

## 2014-06-07 NOTE — H&P (Signed)
Melanie BisonLeticia Valadez Bass is a 38 y.o. [id number] female at 4051w0d by Ultrasound presenting for IOL for gestHTN.   Reports active fetal movement, contractions: irregular, vaginal bleeding: none, membranes: intact. [facility]   Most recent u/s 05/03/14. Pregnancy complicated by Gestational HTN.  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 On prophylactic dose of Lovenox in pregancy; Depression  Past Medical History: Past Medical History  Diagnosis Date  . DVT (deep venous thrombosis)     left leg    Past Surgical History: Past Surgical History  Procedure Laterality Date  . No past surgeries      Obstetrical History: OB History    Gravida Para Term Preterm AB TAB SAB Ectopic Multiple Living   5 4 4       4       Social History: History   Social History  . Marital Status: Widowed    Spouse Name: N/A    Number of Children: N/A  . Years of Education: N/A   Social History Main Topics  . Smoking status: Never Smoker   . Smokeless tobacco: Never Used  . Alcohol Use: No  . Drug Use: No  . Sexual Activity: Yes   Other Topics Concern  . None   Social History Narrative    Family History: Family History  Problem Relation Age of Onset  . Diabetes Mother     Allergies: No Known Allergies  Prescriptions prior to admission  Medication Sig Dispense Refill Last Dose  . Prenatal Vit-Fe Fumarate-FA (PRENATAL MULTIVITAMIN) TABS tablet Take 1 tablet by mouth daily at 12 noon.   06/06/2014 at Unknown time     Review of Systems  Pertinent pos/neg as indicated in HPI    Blood pressure 128/76, pulse 72, temperature 97.6 F (36.4 C), temperature source Oral, resp. rate 18, height 5\' 2"  (1.575 m), weight 175 lb (79.379 kg), last menstrual period 08/16/2013. General appearance: alert, cooperative and no distress Lungs: clear to auscultation bilaterally Heart: regular rate and rhythm Abdomen: gravid, soft, non-tender Extremities: no edema   Fetal monitoring: FHR:  135 bpm, variability: moderate,  Accelerations: Present,  decelerations:  Absent Uterine activity: Irregular  Dilation: Closed Exam by:: Pernell DupreAdams, CNM Presentation: cephalic- confirmed by bedside US.   Prenatal labs: ABO, Rh: --/--/O POS (11/12 0800) Antibody: NEG (11/12 0800) Rubella:   RPR: NON REAC (08/27 1343)  HBsAg: Negative (07/06 0000)  HIV: NONREACTIVE (08/27 1343)  GBS: Negative (10/22 0000)   1 hr Glucola: 188, normal 3 hour Genetic screening:  Declined Anatomy US: Normal at 20 wks  Results for orders placed or performed during the hospital encounter of 06/07/14 (from the past 24 hour(s))  Type and screen   Collection Time: 06/07/14  8:00 AM  Result Value Ref Range   ABO/RH(D) O POS    Antibody Screen NEG    Sample Expiration 06/10/2014   CBC   Collection Time: 06/07/14  8:55 AM  Result Value Ref Range   WBC 7.2 4.0 - 10.5 K/uL   RBC 4.33 3.87 - 5.11 MIL/uL   Hemoglobin 13.6 12.0 - 15.0 g/dL   HCT 13.039.9 86.536.0 - 78.446.0 %   MCV 92.1 78.0 - 100.0 fL   MCH 31.4 26.0 - 34.0 pg   MCHC 34.1 30.0 - 36.0 g/dL   RDW 69.612.9 29.511.5 - 28.415.5 %   Platelets 258 150 - 400 K/uL     Assessment:  7151w0d SIUP  G5P4004  IOL  Cat 1 FHR  GBS  Negative (10/22 0000)  Plan:  Admit to BS  IV pain meds/epidural prn active labor  IOL with cytotec  Anticipate NSVD   Plans to breastfeed  Contraception: Paraguard  Circumcision: Declined  ADAMS,SHNIQUAL SHWON Student NM 06/07/2014, 9:58 AM   I was present for the exam and agree with above.  SW consult PP.   Clinic HR  Dating Dating is by 20 week US  Genetic Screen  Declined  Anatomic US Nml at 20 wks  GTT   Third trimester: 188, 3 hr normal  TDaP vaccine 03/22/14  Flu vaccine 05/17/14  GBS neg  Contraception paraguard  Baby Food  Breast  Circumcision  Declines  Pediatrician  [employer]  Support Person  Daughters and friend    [place] Ketsia Linebaugh, [place] 06/07/2014 7:35 PM

## 2014-06-07 NOTE — Progress Notes (Signed)
Melanie BisonLeticia Valadez Bass is a 38 y.o. [id number] at 4377w0d by ultrasound admitted for induction of labor due to Hypertension.  Subjective: Comfortable, doing well  Objective: BP 131/82 mmHg  Pulse 81  Temp(Src) 98 F (36.7 C) (Oral)  Resp 18  Ht 5\' 2"  (1.575 m)  Wt 175 lb (79.379 kg)  BMI 32.00 kg/m2  LMP 08/16/2013      FHT:  FHR: 130 bpm, variability: moderate,  accelerations:  Present,  decelerations:  Absent UC:   regular, every 2-3 minutes SVE:   Dilation: 2 Effacement (%): Thick Station: -3, Ballotable Exam by:: Melanie KiltsAshley Davis, RN  Labs: Lab Results  Component Value Date   WBC 7.2 06/07/2014   HGB 13.6 06/07/2014   HCT 39.9 06/07/2014   MCV 92.1 06/07/2014   PLT 258 06/07/2014    Assessment / Plan: Induction of labor, progressing slowly Foley bulb placed without difficulty Labor: Induction with cytotec Preeclampsia:  N/A Fetal Wellbeing:  Category I Pain Control:  Fentanyl I/D:  n/a Anticipated MOD:  NSVD  Melanie Bass Melanie Bass 06/07/2014, 6:52 PM

## 2014-06-07 NOTE — Anesthesia Preprocedure Evaluation (Signed)
Anesthesia Evaluation  Patient identified by MRN, date of birth, ID band Patient awake    Reviewed: Allergy & Precautions, H&P , Patient's Chart, lab work & pertinent test results  Airway Mallampati: II  TM Distance: >3 FB Neck ROM: Full    Dental no notable dental hx. (+) Teeth Intact   Pulmonary neg pulmonary ROS,  breath sounds clear to auscultation  Pulmonary exam normal       Cardiovascular hypertension, DVT Rhythm:Regular Rate:Normal     Neuro/Psych PSYCHIATRIC DISORDERS Depression negative neurological ROS     GI/Hepatic negative GI ROS, Neg liver ROS,   Endo/Other  Obesity  Renal/GU negative Renal ROS  negative genitourinary   Musculoskeletal negative musculoskeletal ROS (+)   Abdominal (+) + obese,   Peds  Hematology Hx/o DVT- on lovenox, last dose 11/10   Anesthesia Other Findings   Reproductive/Obstetrics (+) Pregnancy                             Anesthesia Physical Anesthesia Plan  ASA: II  Anesthesia Plan: Epidural   Post-op Pain Management:    Induction:   Airway Management Planned: Natural Airway  Additional Equipment:   Intra-op Plan:   Post-operative Plan:   Informed Consent: I have reviewed the patients History and Physical, chart, labs and discussed the procedure including the risks, benefits and alternatives for the proposed anesthesia with the patient or authorized representative who has indicated his/her understanding and acceptance.     Plan Discussed with: Anesthesiologist  Anesthesia Plan Comments:         Anesthesia Quick Evaluation

## 2014-06-07 NOTE — Anesthesia Procedure Notes (Signed)
Epidural Patient location during procedure: OB Start time: 06/07/2014 9:35 PM  Staffing Anesthesiologist: Tian Davison A. Performed by: anesthesiologist   Preanesthetic Checklist Completed: patient identified, site marked, surgical consent, pre-op evaluation, timeout performed, IV checked, risks and benefits discussed and monitors and equipment checked  Epidural Patient position: sitting Prep: site prepped and draped and DuraPrep Patient monitoring: continuous pulse ox and blood pressure Approach: midline Location: L4-L5 Injection technique: LOR air  Needle:  Needle type: Tuohy  Needle gauge: 17 G Needle length: 9 cm and 9 Needle insertion depth: 6 cm Catheter type: closed end flexible Catheter size: 19 Gauge Catheter at skin depth: 11 cm Test dose: negative and Other  Assessment Events: blood not aspirated, injection not painful, no injection resistance, negative IV test and no paresthesia  Additional Notes Patient identified. Risks and benefits discussed including failed block, incomplete  Pain control, post dural puncture headache, nerve damage, paralysis, blood pressure Changes, nausea, vomiting, reactions to medications-both toxic and allergic and post Partum back pain. All questions were answered. Patient expressed understanding and wished to proceed. Sterile technique was used throughout procedure. Epidural site was Dressed with sterile barrier dressing. No paresthesias, signs of intravascular injection Or signs of intrathecal spread were encountered.  Patient was more comfortable after the epidural was dosed. Please see RN's note for documentation of vital signs and FHR which are stable.

## 2014-06-08 ENCOUNTER — Encounter (HOSPITAL_COMMUNITY): Payer: Self-pay

## 2014-06-08 DIAGNOSIS — Z3A39 39 weeks gestation of pregnancy: Secondary | ICD-10-CM

## 2014-06-08 DIAGNOSIS — O133 Gestational [pregnancy-induced] hypertension without significant proteinuria, third trimester: Secondary | ICD-10-CM

## 2014-06-08 DIAGNOSIS — Z7901 Long term (current) use of anticoagulants: Secondary | ICD-10-CM

## 2014-06-08 DIAGNOSIS — Z86718 Personal history of other venous thrombosis and embolism: Secondary | ICD-10-CM

## 2014-06-08 LAB — CBC
HEMATOCRIT: 37.2 % (ref 36.0–46.0)
HEMATOCRIT: 36.3 % (ref 36.0–46.0)
HEMOGLOBIN: 12.6 g/dL (ref 12.0–15.0)
HEMOGLOBIN: 12.6 g/dL (ref 12.0–15.0)
MCH: 31.7 pg (ref 26.0–34.0)
MCH: 32.2 pg (ref 26.0–34.0)
MCHC: 33.9 g/dL (ref 30.0–36.0)
MCHC: 34.7 g/dL (ref 30.0–36.0)
MCV: 93.7 fL (ref 78.0–100.0)
MCV: 92.8 fL (ref 78.0–100.0)
Platelets: 220 10*3/uL (ref 150–400)
Platelets: 225 10*3/uL (ref 150–400)
RBC: 3.97 MIL/uL (ref 3.87–5.11)
RBC: 3.91 MIL/uL (ref 3.87–5.11)
RDW: 12.9 % (ref 11.5–15.5)
RDW: 12.9 % (ref 11.5–15.5)
WBC: 13.1 10*3/uL — AB (ref 4.0–10.5)
WBC: 14.8 10*3/uL — ABNORMAL HIGH (ref 4.0–10.5)

## 2014-06-08 LAB — CREATININE, SERUM
Creatinine, Ser: 0.77 mg/dL (ref 0.50–1.10)
GFR calc Af Amer: >90 mL/min (ref >90)

## 2014-06-08 MED ORDER — ENOXAPARIN SODIUM 40 MG/0.4ML ~~LOC~~ SOLN
40.0000 mg | SUBCUTANEOUS | Status: DC
  Administered 2014-06-09 – 2014-06-10 (×2): 40 mg via SUBCUTANEOUS
  Filled 2014-06-08 (×2): qty 0.4

## 2014-06-08 MED ORDER — METHYLERGONOVINE MALEATE 0.2 MG/ML IJ SOLN
INTRAMUSCULAR | Status: AC
  Filled 2014-06-08: qty 1

## 2014-06-08 MED ORDER — WITCH HAZEL-GLYCERIN EX PADS
1.0000 "application " | MEDICATED_PAD | CUTANEOUS | Status: DC | PRN
  Administered 2014-06-08 – 2014-06-10 (×2): 1 via TOPICAL

## 2014-06-08 MED ORDER — BISACODYL 10 MG RE SUPP: 10.0000 mg | Freq: Every day | RECTAL | Status: DC | PRN

## 2014-06-08 MED ORDER — FLEET ENEMA 7-19 GM/118ML RE ENEM: 1.0000 | ENEMA | Freq: Every day | RECTAL | Status: DC | PRN

## 2014-06-08 MED ORDER — CARBOPROST TROMETHAMINE 250 MCG/ML IM SOLN
250.0000 ug | INTRAMUSCULAR | Status: DC | PRN
  Administered 2014-06-08: 250 ug via INTRAMUSCULAR

## 2014-06-08 MED ORDER — SENNOSIDES-DOCUSATE SODIUM 8.6-50 MG PO TABS
2.0000 | ORAL_TABLET | ORAL | Status: DC
  Administered 2014-06-10: 2 via ORAL
  Filled 2014-06-08 (×2): qty 2

## 2014-06-08 MED ORDER — MISOPROSTOL 200 MCG PO TABS
1000.0000 ug | ORAL_TABLET | Freq: Once | ORAL | Status: AC
  Administered 2014-06-08: 1000 ug via ORAL

## 2014-06-08 MED ORDER — LANOLIN HYDROUS EX OINT: TOPICAL_OINTMENT | CUTANEOUS | Status: DC | PRN

## 2014-06-08 MED ORDER — ENOXAPARIN SODIUM 80 MG/0.8ML ~~LOC~~ SOLN: 1.0000 mg/kg | SUBCUTANEOUS | Status: DC

## 2014-06-08 MED ORDER — DIBUCAINE 1 % RE OINT
1.0000 "application " | TOPICAL_OINTMENT | RECTAL | Status: DC | PRN
  Administered 2014-06-08 – 2014-06-10 (×2): 1 via RECTAL
  Filled 2014-06-08 (×2): qty 28

## 2014-06-08 MED ORDER — OXYCODONE-ACETAMINOPHEN 5-325 MG PO TABS: 2.0000 | ORAL_TABLET | ORAL | Status: DC | PRN

## 2014-06-08 MED ORDER — SODIUM CHLORIDE 0.9 % IV SOLN: 250.0000 mL | INTRAVENOUS | Status: DC | PRN

## 2014-06-08 MED ORDER — OXYCODONE-ACETAMINOPHEN 5-325 MG PO TABS
1.0000 | ORAL_TABLET | ORAL | Status: DC | PRN
  Administered 2014-06-08 (×2): 1 via ORAL
  Filled 2014-06-08 (×2): qty 1

## 2014-06-08 MED ORDER — PRENATAL MULTIVITAMIN CH
1.0000 | ORAL_TABLET | Freq: Every day | ORAL | Status: DC
  Administered 2014-06-09: 1 via ORAL
  Filled 2014-06-08: qty 1

## 2014-06-08 MED ORDER — OXYTOCIN 40 UNITS IN LACTATED RINGERS INFUSION - SIMPLE MED: 62.5000 mL/h | INTRAVENOUS | Status: DC | PRN

## 2014-06-08 MED ORDER — DIPHENOXYLATE-ATROPINE 2.5-0.025 MG PO TABS
2.0000 | ORAL_TABLET | Freq: Once | ORAL | Status: AC
  Administered 2014-06-08: 2 via ORAL
  Filled 2014-06-08: qty 2

## 2014-06-08 MED ORDER — DIPHENHYDRAMINE HCL 25 MG PO CAPS: 25.0000 mg | ORAL_CAPSULE | Freq: Four times a day (QID) | ORAL | Status: DC | PRN

## 2014-06-08 MED ORDER — SODIUM CHLORIDE 0.9 % IJ SOLN: 3.0000 mL | INTRAMUSCULAR | Status: DC | PRN

## 2014-06-08 MED ORDER — DIPHENOXYLATE-ATROPINE 2.5-0.025 MG/5ML PO LIQD: 10.0000 mL | Freq: Once | ORAL | Status: DC

## 2014-06-08 MED ORDER — ONDANSETRON HCL 4 MG PO TABS: 4.0000 mg | ORAL_TABLET | ORAL | Status: DC | PRN

## 2014-06-08 MED ORDER — ONDANSETRON HCL 4 MG/2ML IJ SOLN: 4.0000 mg | INTRAMUSCULAR | Status: DC | PRN

## 2014-06-08 MED ORDER — BENZOCAINE-MENTHOL 20-0.5 % EX AERO
1.0000 "application " | INHALATION_SPRAY | CUTANEOUS | Status: DC | PRN
  Administered 2014-06-08: 1 via TOPICAL
  Filled 2014-06-08: qty 56

## 2014-06-08 MED ORDER — ZOLPIDEM TARTRATE 5 MG PO TABS: 5.0000 mg | ORAL_TABLET | Freq: Every evening | ORAL | Status: DC | PRN

## 2014-06-08 MED ORDER — SIMETHICONE 80 MG PO CHEW: 80.0000 mg | CHEWABLE_TABLET | ORAL | Status: DC | PRN

## 2014-06-08 MED ORDER — SODIUM CHLORIDE 0.9 % IJ SOLN: 3.0000 mL | Freq: Two times a day (BID) | INTRAMUSCULAR | Status: DC

## 2014-06-08 MED ORDER — MISOPROSTOL 200 MCG PO TABS
ORAL_TABLET | ORAL | Status: AC
  Filled 2014-06-08: qty 5

## 2014-06-08 MED ORDER — IBUPROFEN 600 MG PO TABS
600.0000 mg | ORAL_TABLET | Freq: Four times a day (QID) | ORAL | Status: DC
Start: 2014-06-08 — End: 2014-06-10
  Administered 2014-06-08 – 2014-06-10 (×6): 600 mg via ORAL
  Filled 2014-06-08 (×7): qty 1

## 2014-06-08 NOTE — Lactation Note (Signed)
This note was copied from the chart of Melanie Bass. Lactation Consultation Note  Patient Name: Melanie Bass WRUEA'VToday's Date: 06/08/2014 Reason for consult: Initial assessment Baby 8 hours of life. Utilized the assistance of Shanda BumpsJessica, [employer] Oilin-house Spanish Interpreter with patient. Mom given a hand pump with instructions. Mom requesting the pump because she states that she is wanting to pump and give baby EBM after she returns to work. Discussed with mom that this may me tiring and time-consuming, and she may need a DEBP. Mom states that she used a hand pump in this way before. Mom states that she is have some transient nipple pain on left breast that subsides after first 20 seconds of latching baby. Discussed with mom that this is probably the baby stretching her nipple back into his mouth for a deep latch and should subside. Enc mom to maintain a deep latch and call for assistance with latching as needed. Mom give Spanish [facility], aware of OP/BFSG, community resource, and [facility] phone [facility] services for after discharge.    Maternal Data Does the patient have breastfeeding experience prior to this delivery?: Yes  Feeding Feeding Type:  (Mom dressing baby, not wanting assistance with latching. ) Length of feed: 10 min  LATCH Score/Interventions                      Lactation Tools Discussed/Used     Consult Status Consult Status: Follow-up Date: 06/09/14 Follow-up type: In-patient    Geralynn Bass, Melanie Nemitz 06/08/2014, 9:17 PM

## 2014-06-08 NOTE — Progress Notes (Signed)
   Melanie BisonLeticia Bass Bass is a 38 y.o. [id number] at 1739w1d  admitted for induction of labor due to Hypertension.  Subjective:  Comfortable with epidural.  Foley fell out around 2100, and pitocin was started Objective: Filed Vitals:   06/08/14 0530 06/08/14 0600 06/08/14 0602 06/08/14 0632  BP: 135/67  122/70 131/74  Pulse: 81  95 102  Temp:  98.4 F (36.9 C)    TempSrc:  Oral    Resp:      Height:      Weight:      SpO2:       Total I/O In: 200 [Other:200] Out: -   FHT:  FHR: 140 bpm, variability: moderate,  accelerations:  Present,  decelerations:  Absent UC:   irregular, every 2-4 minutes SVE:   Dilation: 4.5 Effacement (%): Thick Station: -3 Exam by:: ansah-mensah, rnc  Pitocin @ 5 mu/min  Labs: Lab Results  Component Value Date   WBC 10.5 06/07/2014   HGB 13.3 06/07/2014   HCT 38.7 06/07/2014   MCV 93.0 06/07/2014   PLT 229 06/07/2014    Assessment / Plan: Induction of labor due to gestational hypertension,  progressing well on pitocin  Labor: Progressing normally Fetal Wellbeing:  Category I Pain Control:  Epidural Anticipated MOD:  NSVD  CRESENZO-DISHMAN,Melanie Bass 06/08/2014, 6:54 AM

## 2014-06-08 NOTE — Progress Notes (Signed)
   Melanie BisonLeticia Bass Bass is a 38 y.o. [id number] at 6856w1d  admitted for induction of labor due to Hypertension.  Subjective:  Comfortable with epidural.   Objective: Filed Vitals:   06/08/14 0530 06/08/14 0600 06/08/14 0602 06/08/14 0632  BP: 135/67  122/70 131/74  Pulse: 81  95 102  Temp:  98.4 F (36.9 C)    TempSrc:  Oral    Resp:      Height:      Weight:      SpO2:       Total I/O In: 200 [Other:200] Out: -   FHT:  FHR: 140 bpm, variability: moderate,  accelerations:  Present,  decelerations:  Absent UC:   irregular, every 2-3 minutes SVE:   Dilation: 4.5 Effacement (%): Thick Station: -3 Exam by:: ansah-mensah, rnc  Pitocin @ 8 mu/min  Labs: Lab Results  Component Value Date   WBC 10.5 06/07/2014   HGB 13.3 06/07/2014   HCT 38.7 06/07/2014   MCV 93.0 06/07/2014   PLT 229 06/07/2014    Assessment / Plan: Induction of labor due to gestational hypertension,  progressing well on pitocin  Labor: Progressing normally Fetal Wellbeing:  Category I Pain Control:  Epidural Anticipated MOD:  NSVD  CRESENZO-DISHMAN,Adell Panek 06/08/2014, 6:56 AM

## 2014-06-08 NOTE — Plan of Care (Signed)
Problem: Phase I Progression Outcomes Goal: Pain controlled with appropriate interventions Outcome: Completed/Met Date Met:  06/08/14 Goal: Voiding adequately Outcome: Completed/Met Date Met:  06/08/14

## 2014-06-08 NOTE — Plan of Care (Signed)
Problem: Consults Goal: Postpartum Patient Education (See Patient Education module for education specifics.)  Outcome: Progressing     

## 2014-06-09 LAB — CBC
HEMATOCRIT: 29.8 % — AB (ref 36.0–46.0)
Hemoglobin: 10.2 g/dL — ABNORMAL LOW (ref 12.0–15.0)
MCH: 32.3 pg (ref 26.0–34.0)
MCHC: 34.6 g/dL (ref 30.0–36.0)
MCV: 93.4 fL (ref 78.0–100.0)
PLATELETS: 202 10*3/uL (ref 150–400)
RBC: 3.19 MIL/uL — ABNORMAL LOW (ref 3.87–5.11)
RDW: 13.2 % (ref 11.5–15.5)
WBC: 9.5 10*3/uL (ref 4.0–10.5)

## 2014-06-09 NOTE — Progress Notes (Signed)
Interpreter used to order breakfast for patient, at her request.

## 2014-06-09 NOTE — Anesthesia Postprocedure Evaluation (Signed)
Anesthesia Post Note  Patient: Melanie BisonLeticia Valadez Bass  Procedure(s) Performed: * No procedures listed *  Anesthesia type: Epidural  Patient location: Mother/Baby  Post pain: Pain level controlled  Post assessment: Post-op Vital signs reviewed  Last Vitals:  Filed Vitals:   06/09/14 0500  BP: 130/54  Pulse: 78  Temp: 36.6 C  Resp: 18    Post vital signs: Reviewed  Level of consciousness:alert  Complications: No apparent anesthesia complications

## 2014-06-09 NOTE — Plan of Care (Signed)
Problem: Consults Goal: Postpartum Patient Education (See Patient Education module for education specifics.)  Outcome: Progressing  Problem: Phase I Progression Outcomes Goal: OOB as tolerated unless otherwise ordered Outcome: Completed/Met Date Met:  06/09/14 Goal: Initial discharge plan identified Outcome: Completed/Met Date Met:  06/09/14

## 2014-06-09 NOTE — Progress Notes (Signed)
Post Partum Day 1 Subjective:  Melanie Bass is a 38 y.o. G5P5001 385w1d s/p SVD.  No acute events overnight.  Pt denies problems with ambulating, voiding or po intake.  She denies nausea or vomiting.  Pain is moderately controlled.  Lochia Small.  Method of Feeding: breast  Objective: Blood pressure 130/54, pulse 78, temperature 97.9 F (36.6 C), temperature source Oral, resp. rate 18, height 5\' 2"  (1.575 m), weight 175 lb (79.379 kg), last menstrual period 08/16/2013, SpO2 98 %, unknown if currently breastfeeding.  Physical Exam:  General: alert, cooperative and no distress Lochia:normal flow Chest: CTAB Heart: RRR no m/r/g Abdomen: +BS, soft, nontender,  Uterine Fundus: firm DVT Evaluation: No evidence of DVT seen on physical exam. Extremities: no edema   Recent Labs  06/08/14 1510 06/08/14 1755  HGB 12.6 12.6  HCT 37.2 36.3    Assessment/Plan:  ASSESSMENT: Melanie Bass is a 38 y.o. 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

## 2014-06-09 NOTE — Plan of Care (Signed)
Problem: Consults Goal: Postpartum Patient Education (See Patient Education module for education specifics.)  Outcome: Completed/Met Date Met:  06/09/14     

## 2014-06-10 ENCOUNTER — Ambulatory Visit: Payer: Self-pay

## 2014-06-10 MED ORDER — IBUPROFEN 600 MG PO TABS: 600.0000 mg | ORAL_TABLET | Freq: Four times a day (QID) | ORAL | Status: AC

## 2014-06-10 NOTE — Progress Notes (Signed)
NST reactive on 05/28/14

## 2014-06-10 NOTE — Lactation Note (Signed)
This note was copied from the chart of Melanie Bass. Lactation Consultation Note; Mother declines having any concerns about breastfeeding . Reviewed treatment and prevention of engorgement. Mother is aware of available lactation services and community support.   Patient Name: Melanie Bass ZOXWR'UToday's Date: 06/10/2014     Maternal Data    Feeding    LATCH Score/Interventions                      Lactation Tools Discussed/Used     Consult Status      Michel BickersKendrick, Eleonor Ocon McCoy 06/10/2014, 4:27 PM

## 2014-06-10 NOTE — Progress Notes (Signed)
Assisted Patient with ordering of dinner and breakfast.  Spanish Interpreter - Melanie GlassmanBenita Bass

## 2014-06-10 NOTE — Progress Notes (Signed)
Checked on patients needs °Spanish Interpreter - Benita Sanchez °

## 2014-06-10 NOTE — Discharge Instructions (Signed)
Cuidados luego de un parto por vía vaginal °(Postpartum Care After Vaginal Delivery) °Luego del nacimiento del bebé deberá permanecer en el hospital durante 24 a 72 horas, excepto que hubiera existido algún problema, o usted sufra alguna enfermedad. Mientras se encuentre en el hospital recibirá ayuda e instrucciones por parte de las enfermeras y el médico, quienes cuidarán de usted y su bebé y le darán consejos para amamantarlo correctamente, especialmente si es el primer hijo.  °En caso de ser necesario, le prescribirán analgésicos. Observará una pequeña hemorragia vaginal y deberá cambiar los apósitos con frecuencia. Lávese las manos cuidadosamente con agua y jabón durante al menos 20 segundos luego de cambiarse el apósito o ir al baño. Si elimina coágulos o aumenta la hemorragia, infórmelo a la enfermera. No deseche los coágulos sanguíneos antes de mostrárselos a la enfermera, para asegurarse de que no es tejido placentario. °Si le han colocado una vía intravenosa, se la retirarán dentro de las 24 horas, si no hay problemas. La primera vez que se levante de la cama o tome una ducha, llame a la enfermera para que la ayude que puede sentirse débil, mareada o desmayarse. Si está amamantando, puede sentir contracciones dolorosas en el útero durante algunas semanas. Esto es normal y necesario, ya que de este modo el útero vuelve a su tamaño normal. Si no está amamantando, utilice un sostén de soporte y trate de no tocarse las mamas hasta que haya dejado de producir leche. No deben administrarse hormonas para suprimir la leche, debido a que pueden causar coágulos sanguíneos. Podrá seguir una dieta normal, excepto que sufra diabetes o presente otros problemas de salud.  °La enfermera colocará bolsas con hielo en el sitio de la episiotomía (agrandamiento quirúrgico de la apertura vaginal) para reducir el dolor y la hinchazón. En algunos casos raros hay dificultad para orinar, entonces la enfermera deberá vaciarle la  vejiga con un catéter. Si le han practicado una ligadura tubaria durante el posparto ("trompas atadas", esterilización femenina), esto no hará que permanezca más tiempo en el hospital. °Podrá tener al bebé en su habitación todo el tiempo que lo desee si el bebé no tiene ningún problema. Lleve y traiga al bebé de la nursery dentro de la cunita. No lo lleve en brazos. No abandone el área de posparto. Si la madre es Rh negativa (falta de una proteína en los glóbulos rojos) y el bebé es Rh positivo, la madre debe aplicarse la vacuna RhoGam para evitar problemas con el factor Rh en futuros embarazos °Le darán instrucciones por escrito para usted y el bebé y los medicamentos necesarios cuando reciba el alta médica. Asegúrese que comprende y sigue las indicaciones. °INSTRUCCIONES PARA EL CUIDADO DOMICILIARIO °· Siga las instrucciones y tome los medicamentos que le indicaron cuando le dieron el alta médica.  °· Utilice los medicamentos de venta libre o de prescripción para el dolor, el malestar o la fiebre, según se lo indique el profesional que lo asiste.  °· No tome aspirina, ya que puede causar hemorragias.  °· Aumente sus actividades un poco cada día para tener más fuerza y resistencia.  °· No beba alcohol, especialmente si está amamantando o toma analgésicos.  °· Tómese la temperatura dos veces por día y regístrela.  °· Podrá tener una pequeña hemorragia durante 2 a 4 semanas. Esto es normal.  °· No utilice tampones o duchas vaginales, use toallas higiénicas.  °· Trate de que alguna persona permanezca con usted y la ayude durante los primeros días en el hogar.  °·   Descanse o duerma una siesta cuando el bebé duerma.  °· Si está amamantando, use un buen sostén. Si no está amamantando, use un buen sostén y no estimule los pezones.  °· Consuma una dieta sana y siga tomando las vitaminas prenatales.  °· No conduzca vehículos, no realice actividades pesadas ni viaje hasta que su médico la autorice.  °· No mantenga relaciones  sexuales hasta que el médico lo permita.  °· Consulte con el profesional cuando puede comenzar a realizar actividad física y que tipo de ejercicios puede hacer.  °· Comuníquese inmediatamente con el médico si tiene problemas luego del parto.  °· Comuníquese con el pediatra si tiene problemas con el bebé.  °· Programe su visita de control luego del parto y cúmplala.  °SOLICITE ATENCIÓN MÉDICA SI: °· La temperatura se eleva por encima de 100° F (37.8° C).  °· Aumenta la hemorragia vaginal o elimina coágulos. Conserve algunos coágulos para mostrárselos al médico.  °· Observa sangre o siente dolor al orinar.  °· Presenta secreción vaginal con olor fétido.  °· Aumenta el dolor o la inflamación en el sitio de la episiotomía (agrandamiento quirúrgico de la apertura vaginal).  °· Sufre una cefalea grave.  °· Se siente deprimida.  °· La incisión se abre.  °· Se siente mareada o sufre un desmayo.  °· Aparece una erupción cutánea.  °· Tiene una reacción o problemas con su medicamento.  °· Siente dolor u observa enrojecimiento e hinchazón en el sitio de la vía intravenosa.  °SOLICITE ATENCIÓN MÉDICA DE INMEDIATO SI: °· Siente dolor en el pecho.  °· Comienza a sentir falta de aire.  °· Se desmaya.  °· Siente dolor, con o sin hinchazón e irritación en la pierna.  °· Tiene una hemorragia vaginal abundante, con o sin coágulos  °· Siente dolor en el estómago.  °· Observa una secreción vaginal con mal olor.  °ASEGURESE QUE:  °· Comprende estas instrucciones.  °· Controlará su enfermedad.  °· Solicitará ayuda de inmediato si no mejora o empeora.  °Document Released: 05/10/2007 Document Revised: 07/02/2011 °ExitCare® Patient Information ©2012 [employer]. °

## 2014-06-10 NOTE — Plan of Care (Signed)
Problem: Discharge Progression Outcomes Goal: Barriers To Progression Addressed/Resolved Outcome: Completed/Met Date Met:  06/10/14 Goal: Activity appropriate for discharge plan Outcome: Completed/Met Date Met:  06/10/14 Goal: Tolerating diet Outcome: Completed/Met Date Met:  39/03/00 Goal: Complications resolved/controlled Outcome: Completed/Met Date Met:  06/10/14 Goal: Pain controlled with appropriate interventions Outcome: Completed/Met Date Met:  06/10/14 Goal: Afebrile, VS remain stable at discharge Outcome: Completed/Met Date Met:  06/10/14 Goal: Discharge plan in place and appropriate Outcome: Completed/Met Date Met:  06/10/14 Goal: Other Discharge Outcomes/Goals Outcome: Not Applicable Date Met:  92/33/00

## 2014-06-10 NOTE — Discharge Summary (Signed)
Obstetric Discharge Summary Reason for Admission: induction of labor Prenatal Procedures: NST, ultrasound and Lovenox injections. Intrapartum Procedures: spontaneous vaginal delivery Postpartum Procedures: Hemabate for PPH. Complications-Operative and Postpartum: hemorrhage HEMOGLOBIN  Date Value Ref Range Status  06/09/2014 10.2* 12.0 - 15.0 g/dL Final    Comment:    REPEATED TO VERIFY DELTA CHECK NOTED   01/29/2014 14.2 g/dL Final   HCT  Date Value Ref Range Status  06/09/2014 29.8* 36.0 - 46.0 % Final  01/29/2014 41 % Final    Subjective: No problems or concerns. Patient states doing well. Reports minimal bleeding and pain controlled with pain medications. Denies calf pain.  Bottlefeeding/breastfeeding.    Objective: Physical Exam:  Filed Vitals:   06/10/14 0509  BP: 120/48  Pulse: 74  Temp: 98.7 F (37.1 C)  Resp: 18   General: alert, cooperative and appears stated age Lochia: appropriate Uterine Fundus: firm Incision: n/a DVT Evaluation: No evidence of DVT seen on physical exam. Negative Homan's sign.  Discharge Diagnoses: Term Pregnancy-delivered  Discharge Information: Date: 06/10/2014 Activity: pelvic rest Diet: routine Medications: Ibuprofen and Lovenox Condition: stable Instructions:  Vaginal delivery instructions included;  Pt instructed to continue daily lovenox injection.  Will route message to K. Rassette to get patient an appt with [facility] for coumadin bridge.  Discharge to: home Follow-up Information    [facility]   Contact information:   [address] [place] [phone]      Newborn Data: Live born female  Birth Weight: 7 lb 1.8 oz (3226 g) APGAR: 9, 9  Home with mother.  Rochele PagesKARIM, Kimiko Common N 06/10/2014, 7:41 AM

## 2014-06-10 NOTE — Progress Notes (Signed)
Clinical Social Work Department PSYCHOSOCIAL ASSESSMENT - MATERNAL/CHILD 06/10/2014  Patient:  Melanie Bass, Melanie Bass  Account Number:  [id number]  [place] Date:  06/07/2014  Ardine Eng Name:   Melanie Alfred Prescott [place].    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  Informed that FOB died in 2022-11-26.  Met with mother along with her 59 year old daughter.  Daughter seems very supportive of mother. She is a single parent with 4 children ages 59, 35, 4, and 39.  Mother is married, but separated.  Spouse is reportedly not FOB.  Mother states that the entire family is in counseling.  She denied any currently symptoms of depression or anxiety.  She communicate being excited about newborn, but sad that FOB could not share this experience with her.  Family plan to continue with counseling.  No acute social concerns related at this time.  Mother informed of CSW  availability.      VI SOCIAL WORK PLAN Social Work Plan  No Further Intervention Required / No Barriers to Discharge   Type of pt/family education:   PP Depression information and resources.  Provided Informed on survivors benefits through social security

## 2014-06-10 NOTE — Progress Notes (Signed)
Checked on patients needs °Spanish Interpreter - Melanie Bass °

## 2014-06-11 LAB — RUBELLA SCREEN: Rubella: 11.3 Index — ABNORMAL HIGH (ref <0.90)

## 2014-06-11 NOTE — Progress Notes (Signed)
Post discharge chart review completed.  

## 2014-06-19 ENCOUNTER — Telehealth: Payer: Self-pay | Admitting: *Deleted

## 2014-06-19 NOTE — Telephone Encounter (Signed)
-----   Message from Juliette MangleKelly Powell Rassette, RN sent at 06/18/2014  9:01 AM EST ----- Regarding: FW: Coumadin Clinic Appt [facility] for cou[facility]in [facility].  [phone].  Please let me know if you have questions. Thanks, Tresa EndoKelly ----- Message -----    From: Marlis EdelsonWalidah N Karim, CNM    Sent: 06/10/2014   7:47 AM      To: Juliette MangleKelly Powell Rassette, RN, # Subject: Coumadin Clinic Appt                           Sandi MariscalHey Kelly, this patient was discharged home on 06/10/14 and instructed to continue lovenox.  She will need appt to start on coumadin.  Thx.

## 2014-06-19 NOTE — Telephone Encounter (Signed)
[facility] and informed of need for patient appointment for coumadin management.  Referral sheet faxed.  Annice PihJackie will call patient and set up new patient appointment.

## 2014-06-20 NOTE — Addendum Note (Signed)
Addended by: Jill SideAY, Barnard Sharps L on: 06/20/2014 08:36 AM   Modules accepted: Orders

## 2014-06-26 ENCOUNTER — Encounter: Payer: Self-pay | Admitting: Obstetrics & Gynecology

## 2014-06-27 ENCOUNTER — Encounter: Payer: Self-pay | Admitting: Obstetrics & Gynecology

## 2014-07-16 ENCOUNTER — Ambulatory Visit (INDEPENDENT_AMBULATORY_CARE_PROVIDER_SITE_OTHER): Payer: Self-pay | Admitting: Family Medicine

## 2014-07-16 ENCOUNTER — Encounter: Payer: Self-pay | Admitting: Obstetrics & Gynecology

## 2014-07-16 NOTE — Progress Notes (Signed)
Patient ID: Melanie LongestLeticia Valadez Bass, female   DOB: January 03, 1976, 38 y.o.   MRN: [id number] Spanish Interpreter Darel HongMariaElena Jimenez  Pt desires IUD, [facility]

## 2014-07-16 NOTE — Progress Notes (Signed)
Patient ID: Melanie Bass, female   DOB: 09/30/1975, 38 y.o.   MRN: [id number]  Subjective:     Melanie Bass is a 38 y.o. female who presents for a postpartum visit. She is 6 weeks postpartum following a spontaneous vaginal delivery. I have fully reviewed the prenatal and intrapartum course. The delivery was at 39 gestational weeks. Outcome: spontaneous vaginal delivery. Anesthesia: epidural. Postpartum course has been uncomplicated. Baby's course has been normal. Baby is feeding by breast. Bleeding no bleeding. Bowel function is normal. Bladder function is normal. Patient is not sexually active. Contraception method is IUD. Postpartum depression screening: positive however pt feels this is normal.  Her husband past away during pregnancy and she often feels sad when remembering things.  Previous DVT following immobility after accident: last dose of lovenox was 10+ days ago, did not have any more rx for lovenox and did not know [facility]  The following portions of the patient's history were reviewed and updated as appropriate: allergies, current medications, past family history, past medical history, past social history, past surgical history and problem list.  Review of Systems Constitutional: negative Respiratory: negative Cardiovascular: negative Gastrointestinal: negative except heart burn and some nausea Musculoskeletal:negative Neurological: negative  except occasional headache  Objective:    BP 136/74 mmHg  Pulse 67  Temp(Src) 98.5 F (36.9 C)  Wt 160 lb 4.8 oz (72.712 kg)  Breastfeeding? Yes  General:  alert and cooperative   Breasts:  inspection negative, no nipple discharge or bleeding, no masses or nodularity palpable  Lungs: no respiratory distress  Heart:  regular rhythm  Abdomen: soft, non-tender; bowel sounds normal; no masses,  no organomegaly   Vulva:  normal  Vagina: normal vagina           Rectal  Exam: external hemorrhoids        Assessment:     normal postpartum exam. Pap smear not done at today's visit.   Plan:    1. Contraception: IUD 2. Hemorrhoids: encouraged PO hydration, fiber intake 3. Follow up in:as needed.   [facility] Hx of DVT: no longer on anticoagulation, although pt should have been transitioned to coumadin/maintained on therapeutic anticoagulation.  At this time no longer needs prophylaxis.  Perry MountACOSTA,Minh Roanhorse ROCIO, MD 2:32 PM

## 2014-08-23 NOTE — Telephone Encounter (Signed)
Left message for patient

## 2014-09-27 ENCOUNTER — Encounter: Payer: Self-pay | Admitting: Obstetrics & Gynecology

## 2015-03-29 IMAGING — US US OB FOLLOW-UP
2 series · 12 of 28 positions shown · non-contrast
Comparison: none

[Series 1: us ob follow up · 10 of 36 slices shown (1 of 2)]
[im 2/36]
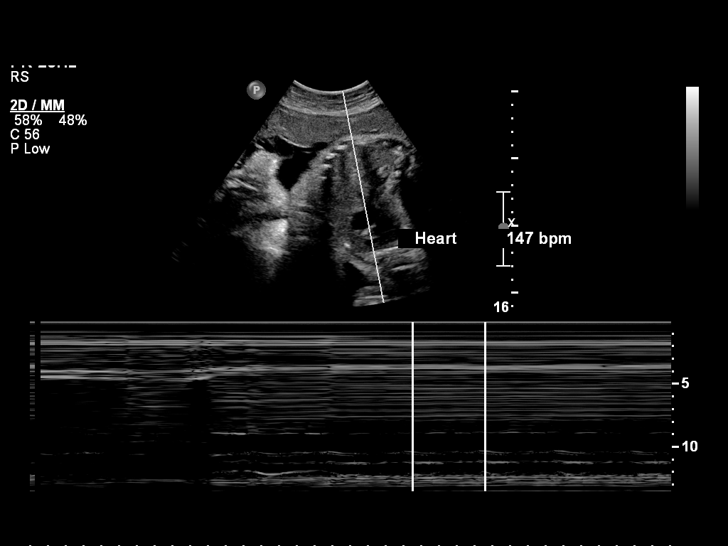
[im 5/36]
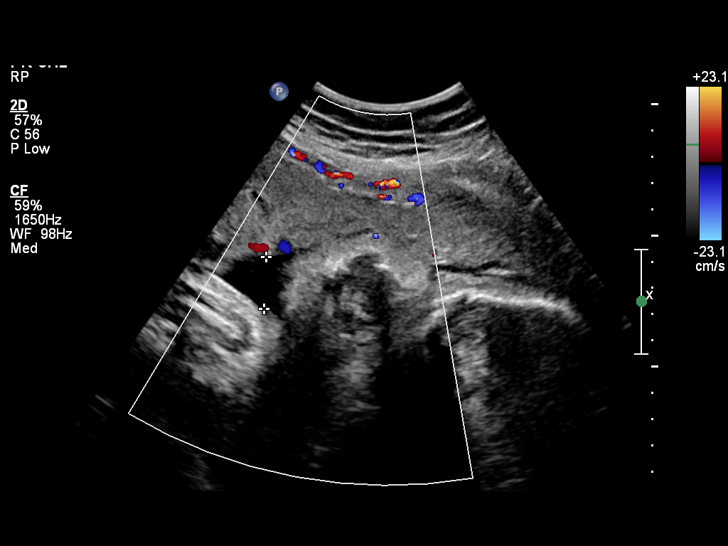
[im 8/36]
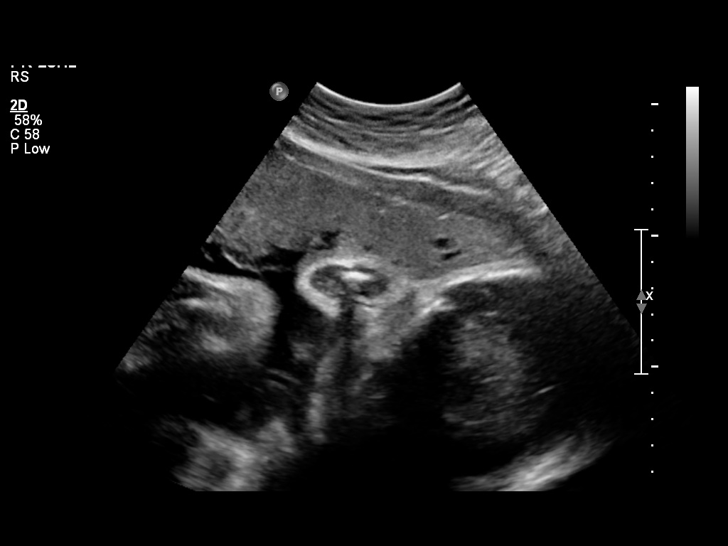
[im 13/36]
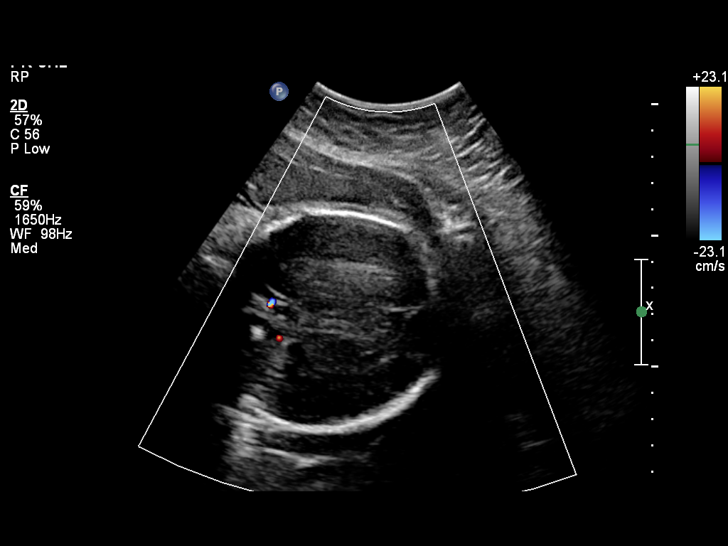
[im 16/36]
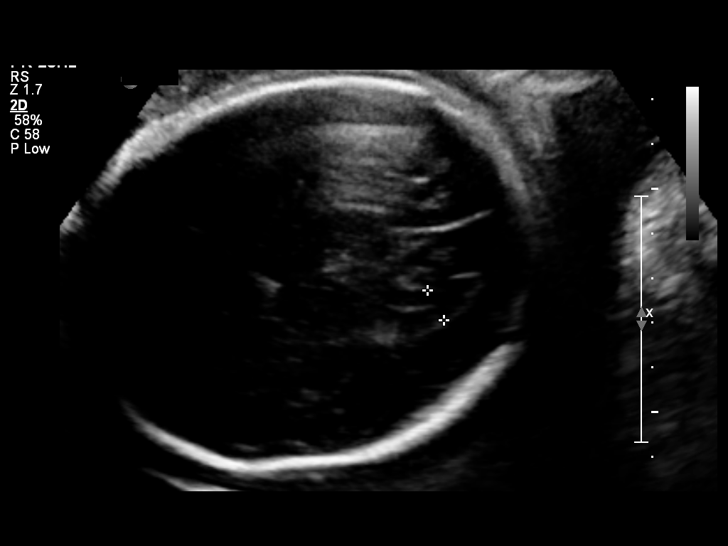
[im 20/36]
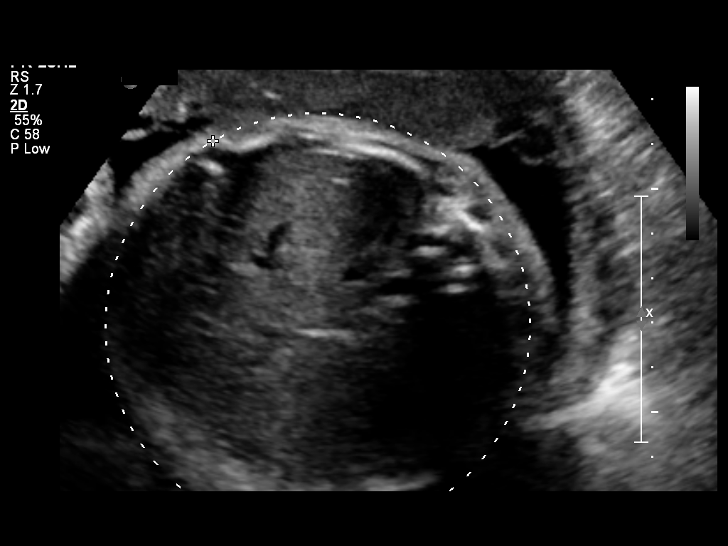
[im 24/36]
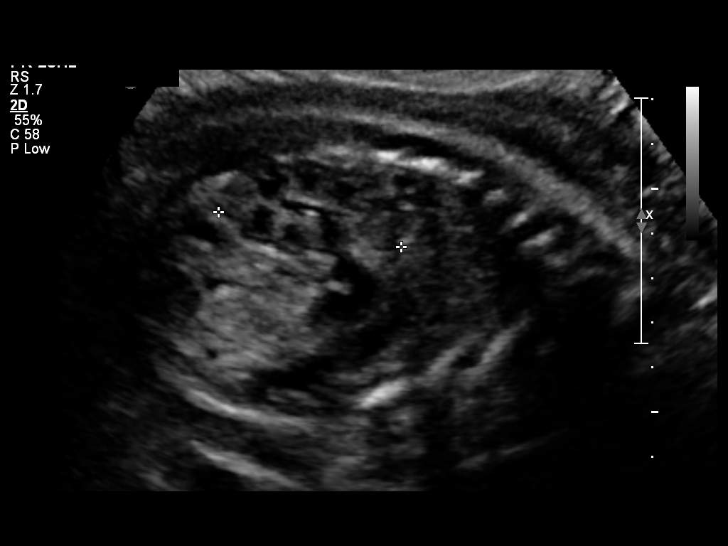
[im 28/36]
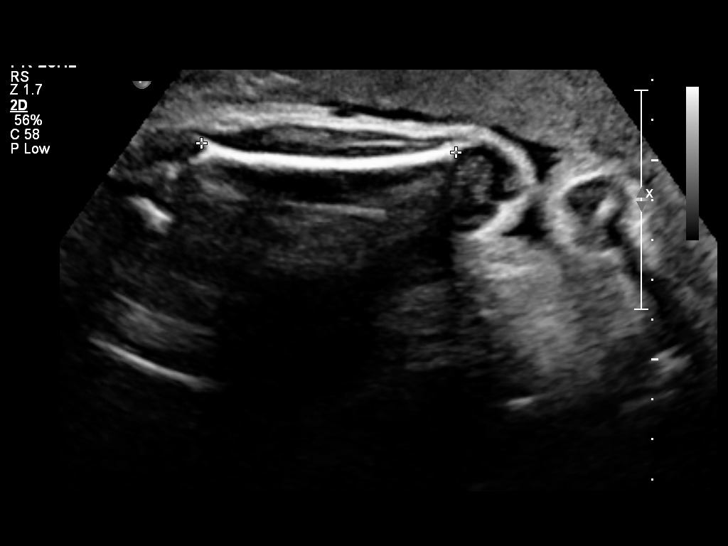
[im 31/36]
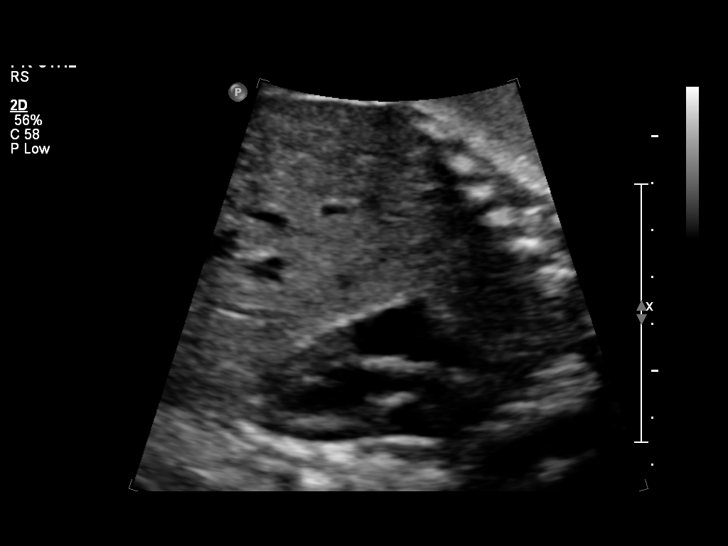
[im 36/36]
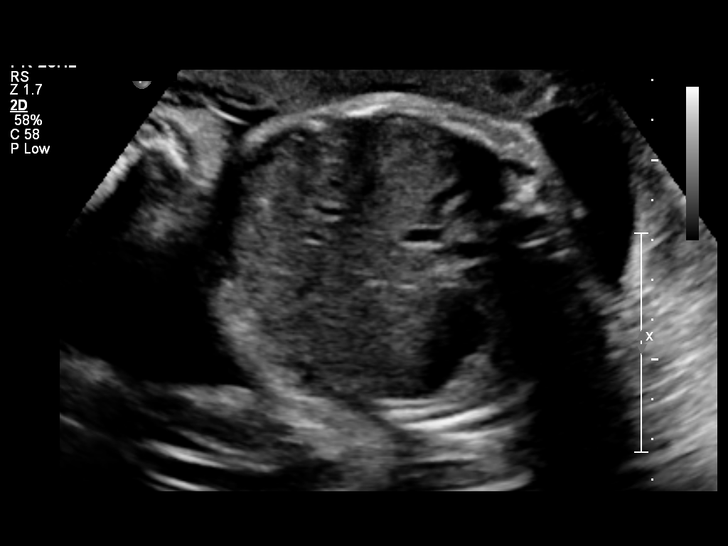

[Series 1: us ob follow up · 2 of 7 slices shown (2 of 2)]
[im 2/7]
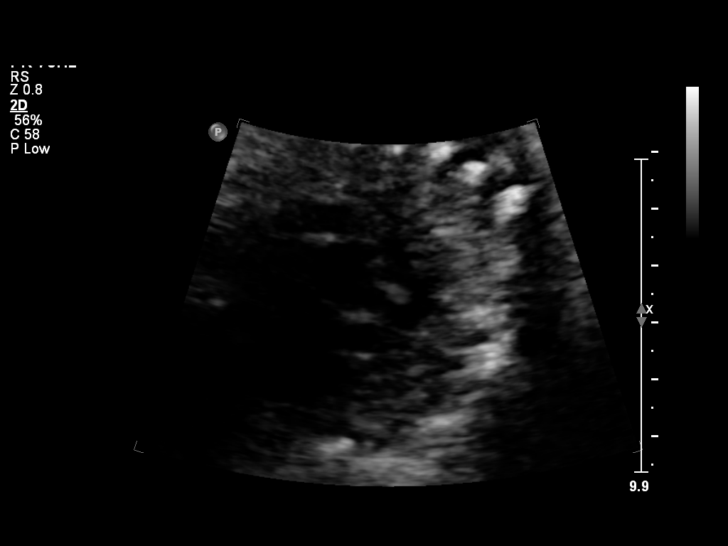
[im 5/7]
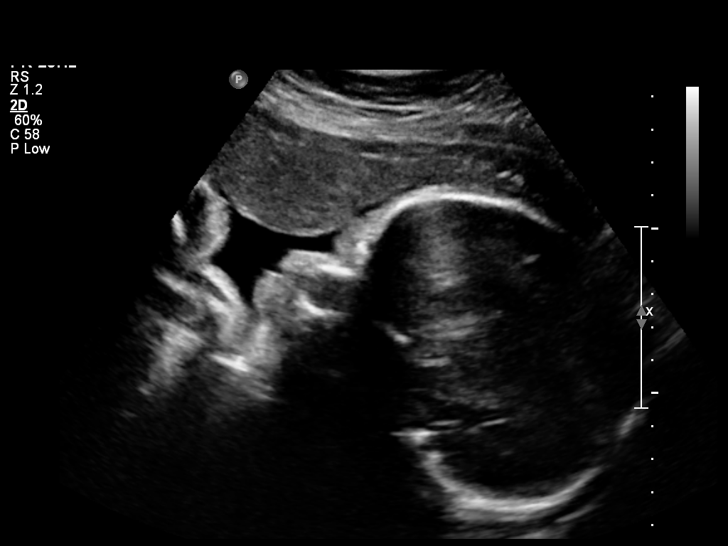

[12 of 28 positions shown; findings below may reference images not displayed]

OBSTETRICS REPORT
                      (Signed Final 05/03/2014 [DATE])

             [facility]

Service(s) Provided

 US OB FOLLOW UP                                       76816.1
Indications

 34 weeks gestation of pregnancy
 Advanced maternal age (AMA), Multigravida - 38
 Gestational hypertension without significant
 proteinuria, third trimester
Fetal Evaluation

 Num Of Fetuses:    1
 Fetal Heart Rate:  147                          bpm
 Cardiac Activity:  Observed
 Presentation:      Cephalic
 Placenta:          Anterior, above cervical os
 P. Cord            Visualized, central
 Insertion:

 Amniotic Fluid
 AFI FV:      Subjectively within normal limits
 AFI Sum:     11.9    cm       33  %Tile     Larg Pckt:    5.94  cm
 RUQ:   5.94    cm   RLQ:    2.75   cm    LUQ:   1.24    cm   LLQ:    1.97   cm
Biometry

 BPD:     84.4  mm     G. Age:  34w 0d                CI:        77.36   70 - 86
                                                      FL/HC:      20.9   19.4 -

 HC:     303.8  mm     G. Age:  33w 5d       13  %    HC/AC:      1.02   0.96 -

 AC:     297.9  mm     G. Age:  33w 6d       47  %    FL/BPD:     75.4   71 - 87
 FL:      63.6  mm     G. Age:  32w 6d       16  %    FL/AC:      21.3   20 - 24

 Est. FW:    2261  gm    4 lb 14 oz      50  %
Gestational Age

 LMP:           37w 1d        Date:  08/16/13                 EDD:   05/23/14
 U/S Today:     33w 4d                                        EDD:   06/17/14
 Best:          34w 0d     Det. By:  U/S (01/31/14)           EDD:   06/14/14
Anatomy
 Cranium:          Appears normal         Aortic Arch:      Appears normal
 Fetal Cavum:      Previously seen        Ductal Arch:      Appears normal
 Ventricles:       Previously seen        Diaphragm:        Appears normal
 Choroid Plexus:   Previously seen        Stomach:          Appears normal, left
                                                            sided
 Cerebellum:       Previously seen        Abdomen:          Appears normal
 Posterior Fossa:  Previously seen        Abdominal Wall:   Previously seen
 Nuchal Fold:      Previously seen        Cord Vessels:     Previously seen
 Face:             Orbits and profile     Kidneys:          Appear normal
                   previously seen
 Lips:             Previously seen        Bladder:          Appears normal
 Heart:            Previously seen        Spine:            Previously seen
 RVOT:             Appears normal         Lower             Previously seen
                                          Extremities:
 LVOT:             Appears normal         Upper             Previously seen
                                          Extremities:

 Other:  Fetus appears to be a male. Heels and 5th digit prev. visualized.
         Technically difficult due to fetal position.
Cervix Uterus Adnexa

 Cervix:       Not visualized (advanced GA >67wks)
Impression

 IUP at 34+0 weeks (remote read of images only)
 EFW 50th%'le
 Normal amniotic fluid volume
Recommendations

 Given GHTN, recommend weekly AFI and twice weekly NST
 between now and delivery.  Otherwise, management as
 clinically indicated.

## 2017-11-18 ENCOUNTER — Emergency Department (HOSPITAL_COMMUNITY): Payer: Self-pay

## 2017-11-18 ENCOUNTER — Emergency Department (HOSPITAL_COMMUNITY)
Admission: EM | Admit: 2017-11-18 | Discharge: 2017-11-19 | Disposition: A | Discharge status: Home / Self Care | Payer: Self-pay | Attending: Emergency Medicine | Admitting: Emergency Medicine

## 2017-11-18 ENCOUNTER — Other Ambulatory Visit: Payer: Self-pay

## 2017-11-18 ENCOUNTER — Encounter (HOSPITAL_COMMUNITY): Payer: Self-pay

## 2017-11-18 DIAGNOSIS — R55 Syncope and collapse: Secondary | ICD-10-CM | POA: Insufficient documentation

## 2017-11-18 DIAGNOSIS — Z79899 Other long term (current) drug therapy: Secondary | ICD-10-CM | POA: Insufficient documentation

## 2017-11-18 LAB — I-STAT BETA HCG BLOOD, ED (MC, WL, AP ONLY)

## 2017-11-18 LAB — CBC
HCT: 44.2 % (ref 36.0–46.0)
Hemoglobin: 14.7 g/dL (ref 12.0–15.0)
MCH: 30.8 pg (ref 26.0–34.0)
MCHC: 33.3 g/dL (ref 30.0–36.0)
MCV: 92.5 fL (ref 78.0–100.0)
Platelets: 279 10*3/uL (ref 150–400)
RBC: 4.78 MIL/uL (ref 3.87–5.11)
RDW: 12.8 % (ref 11.5–15.5)
WBC: 6.8 10*3/uL (ref 4.0–10.5)

## 2017-11-18 LAB — BASIC METABOLIC PANEL
Anion gap: 7 (ref 5–15)
BUN: 18 mg/dL (ref 6–20)
CO2: 26 mmol/L (ref 22–32)
CREATININE: 1.1 mg/dL — AB (ref 0.44–1.00)
Calcium: 8.9 mg/dL (ref 8.9–10.3)
Chloride: 110 mmol/L (ref 101–111)
Glucose, Bld: 96 mg/dL (ref 65–99)
Potassium: 4.2 mmol/L (ref 3.5–5.1)
Sodium: 143 mmol/L (ref 135–145)

## 2017-11-18 LAB — URINALYSIS, ROUTINE W REFLEX MICROSCOPIC
Bilirubin Urine: NEGATIVE
Glucose, UA: NEGATIVE mg/dL
HGB URINE DIPSTICK: NEGATIVE
Ketones, ur: NEGATIVE mg/dL
Leukocytes, UA: NEGATIVE
NITRITE: NEGATIVE
Protein, ur: NEGATIVE mg/dL
Specific Gravity, Urine: 1.023 (ref 1.005–1.030)
pH: 6 (ref 5.0–8.0)

## 2017-11-18 NOTE — ED Notes (Signed)
Spoke to Pt about wait time. Pt verbalizes understanding.

## 2017-11-18 NOTE — ED Triage Notes (Signed)
Pt here with family endorsing 2 syncopal episodes, once last night and 6 days ago. Pt states "this happens 2-3 times per week" Pt has never been evaluated for this problem. States that she had chest pain last night after passing out. VSS.

## 2017-11-18 NOTE — ED Provider Notes (Signed)
Patient placed in Quick Look pathway, seen and evaluated   Chief Complaint: syncope  HPI:   Pt is a 42 y.o. y/o female  with a PMHx of remote DVT after immobility >4226yrs ago not on anticoagulation, presenting today with c/o multiple syncopal events, the last one being yesterday.  Patient's family states that she will be sitting and become "unresponsive" with her eyes closed and not responding to verbal stimuli for about 30 minutes.  When she wakes up she seems confused.  Patient is unable to further elaborate on this at all, is unable to report whether she feels lightheaded or not prior to the "passing out" episodes.  They deny any seizure-like activity.  She states that when she woke up yesterday after her reported syncopal event, she had some central chest pain which is no longer ongoing.  She feels generally weak and has a mild headache.  She denies any vision changes, shortness of breath, abdominal pain, nausea, vomiting, numbness, tingling, focal weakness, or any other complaints at this time.  ROS: +CP (resolved), +HA, +LOC. No vision changes, SOB, abd pain, n/v, numbness, tingling, weakness. No seizure like activity  Physical Exam:  BP (!) 160/89 (BP Location: Right Arm)   Pulse 78   Temp 98 F (36.7 C) (Oral)   Resp 16   Ht 5\' 1"  (1.549 m)   Wt 68.9 kg (152 lb)   SpO2 100%   Breastfeeding? No   BMI 28.72 kg/m    Gen: No distress, laughing with family at bedside  Neuro: CN 2-12 grossly intact, A&O x4, GCS 15, Sensation and strength intact, Gait nonataxic, Coordination with finger-to-nose WNL   Skin: Warm    Focused Exam: eyes: PERRL, EOMI, no nystagmus   Initiation of care has begun. The patient has been counseled on the process, plan, and necessity for staying for the completion/evaluation, and the remainder of the medical screening examination     [address], [place]cedes, [place]-C 11/18/17 1749    Alvira MondaySchlossman, Erin, MD 11/20/17 2137

## 2017-11-19 LAB — D-DIMER, QUANTITATIVE: D-Dimer, Quant: 0.35 ug/mL-FEU (ref 0.00–0.50)

## 2017-11-19 LAB — TROPONIN I

## 2017-11-19 NOTE — ED Provider Notes (Signed)
[facility] EMERGENCY DEPARTMENT Provider Note   CSN: [id number] Arrival date & time: 11/18/17  1641     History   Chief Complaint Chief Complaint  Patient presents with  . Loss of Consciousness    HPI Melanie Bass is a 42 y.o. female.  Patient presents to the ER for evaluation of recurrent syncope.  Patient reports that she has been having episodes of syncope since her mother died in 05/14/23.  These episodes have become more frequent recently, over the last 3 or 4 weeks.  She has a sensation of agitation, like she needs to "do something with her hands".  She then feels extremely fatigued and tired.  Within the hour, she has a syncopal episode.  Husband describes episodes as her "going out".  He reports that she becomes completely unresponsive, takes approximately 30 minutes to become awake again.  He cannot awaken her by shaking or yelling her name.  She is completely immobile when this happens, no shaking or seizure-like activity.  After she wakes up she seems confused for a period of time.  She does report that after the episode she generally feels weak, short of breath, heart sometimes races and she developed some chest pain occasionally.  She reports that this happened when she was a young girl, around 42 years old but then stopped.     Past Medical History:  Diagnosis Date  . DVT (deep venous thrombosis) (HCC)    left leg    Patient Active Problem List   Diagnosis Date Noted  . Gestational hypertension 04/26/2014  . Supervision of high-risk pregnancy of elderly multigravida (>= 14 years old at time of delivery) 02/01/2014  . Depression 01/31/2014  . Psychosocial stressors 01/31/2014  . Late prenatal care 01/31/2014  . Advanced maternal age (AMA) in pregnancy 01/31/2014  . Trichimoniasis 01/31/2014  . DEEP VENOUS THROMBOPHLEBITIS, HX OF 12/01/2007    Past Surgical History:  Procedure Laterality Date  . NO PAST SURGERIES       OB History     Gravida  5   Para  5   Term  5   Preterm      AB      Living  5     SAB      TAB      Ectopic      Multiple  0   Live Births  1            Home Medications    Prior to Admission medications   Medication Sig Start Date End Date Taking? Authorizing Provider  ibuprofen (ADVIL,MOTRIN) 600 MG tablet Take 1 tablet (600 mg total) by mouth every 6 (six) hours. Patient not taking: Reported on 07/16/2014 06/10/14   Marlis Edelson, CNM  Prenatal Vit-Fe Fumarate-FA (PRENATAL MULTIVITAMIN) TABS tablet Take 1 tablet by mouth daily at 12 noon.    [provider]    Family History Family History  Problem Relation Age of Onset  . Diabetes Mother     Social History Social History   Tobacco Use  . Smoking status: Never Smoker  . Smokeless tobacco: Never Used  Substance Use Topics  . Alcohol use: No  . Drug use: No     Allergies   Patient has no known allergies.   Review of Systems Review of Systems  Respiratory: Positive for shortness of breath.   Cardiovascular: Positive for chest pain.  Neurological: Positive for syncope.  All other systems reviewed and are negative.  Physical Exam Updated Vital Signs BP 132/78   Pulse 72   Temp 98 F (36.7 C) (Oral)   Resp 16   Ht 5\' 1"  (1.549 m)   Wt 68.9 kg (152 lb)   SpO2 98%   Breastfeeding? No   BMI 28.72 kg/m   Physical Exam  Constitutional: She is oriented to person, place, and time. She appears well-developed and well-nourished. No distress.  HENT:  Head: Normocephalic and atraumatic.  Right Ear: Hearing normal.  Left Ear: Hearing normal.  Nose: Nose normal.  Mouth/Throat: Oropharynx is clear and moist and mucous membranes are normal.  Eyes: Pupils are equal, round, and reactive to light. Conjunctivae and EOM are normal.  Neck: Normal range of motion. Neck supple.  Cardiovascular: Regular rhythm, S1 normal and S2 normal. Exam reveals no gallop and no friction rub.  No murmur  heard. Pulmonary/Chest: Effort normal and breath sounds normal. No respiratory distress. She exhibits no tenderness.  Abdominal: Soft. Normal appearance and bowel sounds are normal. There is no hepatosplenomegaly. There is no tenderness. There is no rebound, no guarding, no tenderness at McBurney's point and negative Murphy's sign. No hernia.  Musculoskeletal: Normal range of motion.  Neurological: She is alert and oriented to person, place, and time. She has normal strength. No cranial nerve deficit or sensory deficit. Coordination normal. GCS eye subscore is 4. GCS verbal subscore is 5. GCS motor subscore is 6.  Skin: Skin is warm, dry and intact. No rash noted. No cyanosis.  Psychiatric: She has a normal mood and affect. Her speech is normal and behavior is normal. Thought content normal.  Nursing note and vitals reviewed.    ED Treatments / Results  Labs (all labs ordered are listed, but only abnormal results are displayed) Labs Reviewed  BASIC METABOLIC PANEL - Abnormal; Notable for the following components:      Result Value   Creatinine, Ser 1.10 (*)    All other components within normal limits  URINALYSIS, ROUTINE W REFLEX MICROSCOPIC - Abnormal; Notable for the following components:   APPearance CLOUDY (*)    All other components within normal limits  CBC  D-DIMER, QUANTITATIVE (NOT AT [facility])  TROPONIN I  I-STAT BETA HCG BLOOD, ED ([facility], [facility], [facility] ONLY)  I-STAT TROPONIN, ED    EKG EKG Interpretation  Date/Time:  Thursday November 18 2017 17:26:31 EDT Ventricular Rate:  81 PR Interval:  136 QRS Duration: 84 QT Interval:  376 QTC Calculation: 436 R Axis:   71 Text Interpretation:  Normal sinus rhythm with sinus arrhythmia Normal ECG Confirmed by Gilda Crease [phone]) on 11/19/2017 12:02:59 AM   Radiology Dg Chest 2 View  Result Date: 11/18/2017 CLINICAL DATA:  Syncope and chest pain EXAM: CHEST - 2 VIEW COMPARISON:  None. FINDINGS: The heart size and mediastinal  contours are within normal limits. Both lungs are clear. The visualized skeletal structures are unremarkable. IMPRESSION: No active cardiopulmonary disease. Electronically Signed   By: Jasmine Pang M.D.   On: 11/18/2017 17:59   Ct Head Wo Contrast  Result Date: 11/18/2017 CLINICAL DATA:  Multiple syncopal episodes. EXAM: CT HEAD WITHOUT CONTRAST TECHNIQUE: Contiguous axial images were obtained from the base of the skull through the vertex without intravenous contrast. COMPARISON:  None. FINDINGS: Brain: No mass lesion, intraparenchymal hemorrhage or extra-axial collection. No evidence of acute cortical infarct. Normal appearance of the brain parenchyma and extra axial spaces for age. There is a single cortical calcification at the anterior left frontal lobe, nonspecific but  probably vascular. Vascular: No hyperdense vessel or unexpected vascular calcification. Skull: Normal visualized skull base, calvarium and extracranial soft tissues. Sinuses/Orbits: No sinus fluid levels or advanced mucosal thickening. No mastoid effusion. Normal orbits. IMPRESSION: Unremarkable head CT. No acute abnormality or finding to explain the reported syncopal events. Electronically Signed   By: Deatra RobinsonKevin  Herman M.D.   On: 11/18/2017 18:31    Procedures Procedures (including critical care time)  Medications Ordered in ED Medications - No data to display   Initial Impression / Assessment and Plan / ED Course  I have reviewed the triage vital signs and the nursing notes.  Pertinent labs & imaging results that were available during my care of the patient were reviewed by me and considered in my medical decision making (see chart for details).     Patient presents to the emergency department for evaluation of recurrent syncope.  Symptoms began after the death of her mother in October.  She also reports similar symptoms as a child.  She gets vague symptoms of feeling weak all over prior to the episodes.  She then developed  chest pain, shortness of breath, heart palpitations and worsening weakness after she wakes up.  Husband has been present for multiple episodes, has never seen anything that looks like seizure.  Focal seizure would be a possibility, however nothing he describes seems consistent with this.  She has symptoms of chest pain and shortness of breath after surgery episodes, but never prior.  Symptoms do not seem to be precipitated by changes in position and there are no signs of hypotension before passing out.  Her work-up has been unrevealing here in the ER.  Head CT, blood work unremarkable.  EKG is normal.  She has not had any arrhythmia here in the ER.  Troponin is negative.  D-dimer is negative.  With the symptoms beginning after the death of her mother, psychosomatic etiology would be considered, however, will require further investigation.  Will refer to neurology and cardiology.  Final Clinical Impressions(s) / ED Diagnoses   Final diagnoses:  Syncope, unspecified syncope type    ED Discharge Orders    None       Pollina, Canary Brimhristopher J, MD 11/19/17 [phone]

## 2017-12-23 ENCOUNTER — Telehealth: Payer: Self-pay

## 2017-12-23 NOTE — Telephone Encounter (Signed)
SENT REFERRAL TO SCHEDULING 

## 2018-04-07 ENCOUNTER — Encounter: Payer: Self-pay | Admitting: Interventional Cardiology

## 2018-04-19 NOTE — Progress Notes (Deleted)
Cardiology Office Note:    Date:  04/19/2018   ID:  Melanie BisonLeticia Valadez Bass, DOB [birth year], MRN [id number]  PCP:  Patient, No Pcp Per  Cardiologist:  No primary care provider on file.   Referring MD: Martie RoundSpencer, Nicole, NP   No chief complaint on file. ***  History of Present Illness:    Melanie LongestLeticia Valadez Bass is a 42 y.o. female with a hx of   Past Medical History:  Diagnosis Date  . DVT (deep venous thrombosis) (HCC)    left leg    Past Surgical History:  Procedure Laterality Date  . NO PAST SURGERIES      Current Medications: No outpatient medications have been marked as taking for the 04/20/18 encounter (Appointment) with Lyn RecordsSmith, Henry W, MD.     Allergies:   Patient has no known allergies.   Social History   Socioeconomic History  . Marital status: Widowed    Spouse name: Not on file  . Number of children: Not on file  . Years of education: Not on file  . Highest education level: Not on file  Occupational History  . Not on file  Social Needs  . Financial resource strain: Not on file  . Food insecurity:    Worry: Not on file    Inability: Not on file  . Transportation needs:    Medical: Not on file    Non-medical: Not on file  Tobacco Use  . Smoking status: Never Smoker  . Smokeless tobacco: Never Used  Substance and Sexual Activity  . Alcohol use: No  . Drug use: No  . Sexual activity: Yes  Lifestyle  . Physical activity:    Days per week: Not on file    Minutes per session: Not on file  . Stress: Not on file  Relationships  . Social connections:    Talks on phone: Not on file    Gets together: Not on file    Attends religious service: Not on file    Active member of club or organization: Not on file    Attends meetings of clubs or organizations: Not on file    Relationship status: Not on file  Other Topics Concern  . Not on file  Social History Narrative  . Not on file     Family History: The patient's ***family history includes  Diabetes in her mother.  ROS:   Please see the history of present illness.    *** All other systems reviewed and are negative.  EKGs/Labs/Other Studies Reviewed:    The following studies were reviewed today: ***  EKG:  EKG is *** ordered today.  The ekg ordered today demonstrates ***  Recent Labs: 11/18/2017: BUN 18; Creatinine, Ser 1.10; Hemoglobin 14.7; Platelets 279; Potassium 4.2; Sodium 143  Recent Lipid Panel No results found for: CHOL, TRIG, HDL, CHOLHDL, VLDL, LDLCALC, LDLDIRECT  Physical Exam:    VS:  There were no vitals taken for this visit.    Wt Readings from Last 3 Encounters:  11/18/17 152 lb (68.9 kg)  07/16/14 160 lb 4.8 oz (72.7 kg)  06/07/14 175 lb (79.4 kg)     GEN: *** Well nourished, well developed in no acute distress HEENT: Normal NECK: No JVD. LYMPHATICS: No lymphadenopathy CARDIAC: ***RRR, ***murmur, ***gallop, *** edema. VASCULAR: *** pulses. *** bruits. RESPIRATORY:  Clear to auscultation without rales, wheezing or rhonchi  ABDOMEN: Soft, non-tender, non-distended, No pulsatile mass, MUSCULOSKELETAL: No deformity  SKIN: Warm and dry NEUROLOGIC:  Alert and oriented x 3  PSYCHIATRIC:  Normal affect   ASSESSMENT:    1. Syncope and collapse    PLAN:    In order of problems listed above:  1. ***   Medication Adjustments/Labs and Tests Ordered: Current medicines are reviewed at length with the patient today.  Concerns regarding medicines are outlined above.  No orders of the defined types were placed in this encounter.  No orders of the defined types were placed in this encounter.   There are no Patient Instructions on file for this visit.   Signed, Lesleigh Noe, MD  04/19/2018 4:38 PM    [facility]

## 2018-04-20 ENCOUNTER — Ambulatory Visit: Payer: Self-pay | Admitting: Interventional Cardiology

## 2018-04-21 ENCOUNTER — Encounter: Payer: Self-pay | Admitting: Interventional Cardiology

## 2018-06-29 ENCOUNTER — Ambulatory Visit: Payer: Self-pay

## 2018-07-13 ENCOUNTER — Ambulatory Visit: Payer: Self-pay | Attending: Oncology
